# Patient Record
Sex: Female | Born: 1990 | Race: Black or African American | Hispanic: No | Marital: Single | Smoking: Former smoker
Health system: Southern US, Community
[De-identification: ages and names within clinical notes are randomized; demographics above are authoritative.]

## PROBLEM LIST (undated history)

## (undated) DIAGNOSIS — C8190 Hodgkin lymphoma, unspecified, unspecified site: Secondary | ICD-10-CM

---
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
## 2012-12-08 ENCOUNTER — Encounter (HOSPITAL_BASED_OUTPATIENT_CLINIC_OR_DEPARTMENT_OTHER): Payer: Self-pay | Admitting: *Deleted

## 2012-12-08 ENCOUNTER — Emergency Department (HOSPITAL_BASED_OUTPATIENT_CLINIC_OR_DEPARTMENT_OTHER)
Admission: EM | Admit: 2012-12-08 | Discharge: 2012-12-08 | Disposition: A | Discharge status: Home / Self Care | Payer: BC Managed Care – PPO | Attending: Emergency Medicine | Admitting: Emergency Medicine

## 2012-12-08 ENCOUNTER — Emergency Department (HOSPITAL_BASED_OUTPATIENT_CLINIC_OR_DEPARTMENT_OTHER): Payer: BC Managed Care – PPO

## 2012-12-08 DIAGNOSIS — K6289 Other specified diseases of anus and rectum: Secondary | ICD-10-CM | POA: Insufficient documentation

## 2012-12-08 DIAGNOSIS — Z3202 Encounter for pregnancy test, result negative: Secondary | ICD-10-CM | POA: Insufficient documentation

## 2012-12-08 DIAGNOSIS — K5900 Constipation, unspecified: Secondary | ICD-10-CM

## 2012-12-08 DIAGNOSIS — R109 Unspecified abdominal pain: Secondary | ICD-10-CM | POA: Insufficient documentation

## 2012-12-08 DIAGNOSIS — N3090 Cystitis, unspecified without hematuria: Secondary | ICD-10-CM

## 2012-12-08 DIAGNOSIS — F17200 Nicotine dependence, unspecified, uncomplicated: Secondary | ICD-10-CM | POA: Insufficient documentation

## 2012-12-08 LAB — URINALYSIS, ROUTINE W REFLEX MICROSCOPIC
Bilirubin Urine: NEGATIVE
Glucose, UA: NEGATIVE mg/dL
Ketones, ur: NEGATIVE mg/dL
Nitrite: NEGATIVE
Protein, ur: NEGATIVE mg/dL
Specific Gravity, Urine: 1.029 (ref 1.005–1.030)
Urobilinogen, UA: 1 mg/dL (ref 0.0–1.0)
pH: 7 (ref 5.0–8.0)

## 2012-12-08 LAB — URINE MICROSCOPIC-ADD ON

## 2012-12-08 LAB — PREGNANCY, URINE: Preg Test, Ur: NEGATIVE

## 2012-12-08 IMAGING — CR DG ABDOMEN 1V
2 series · 2 of 2 positions shown · non-contrast
Comparison: None.

CLINICAL DATA: Lower abdominal pain, constipation

ABDOMEN - 1 VIEW

[t abdomen supine (1 of 2)]
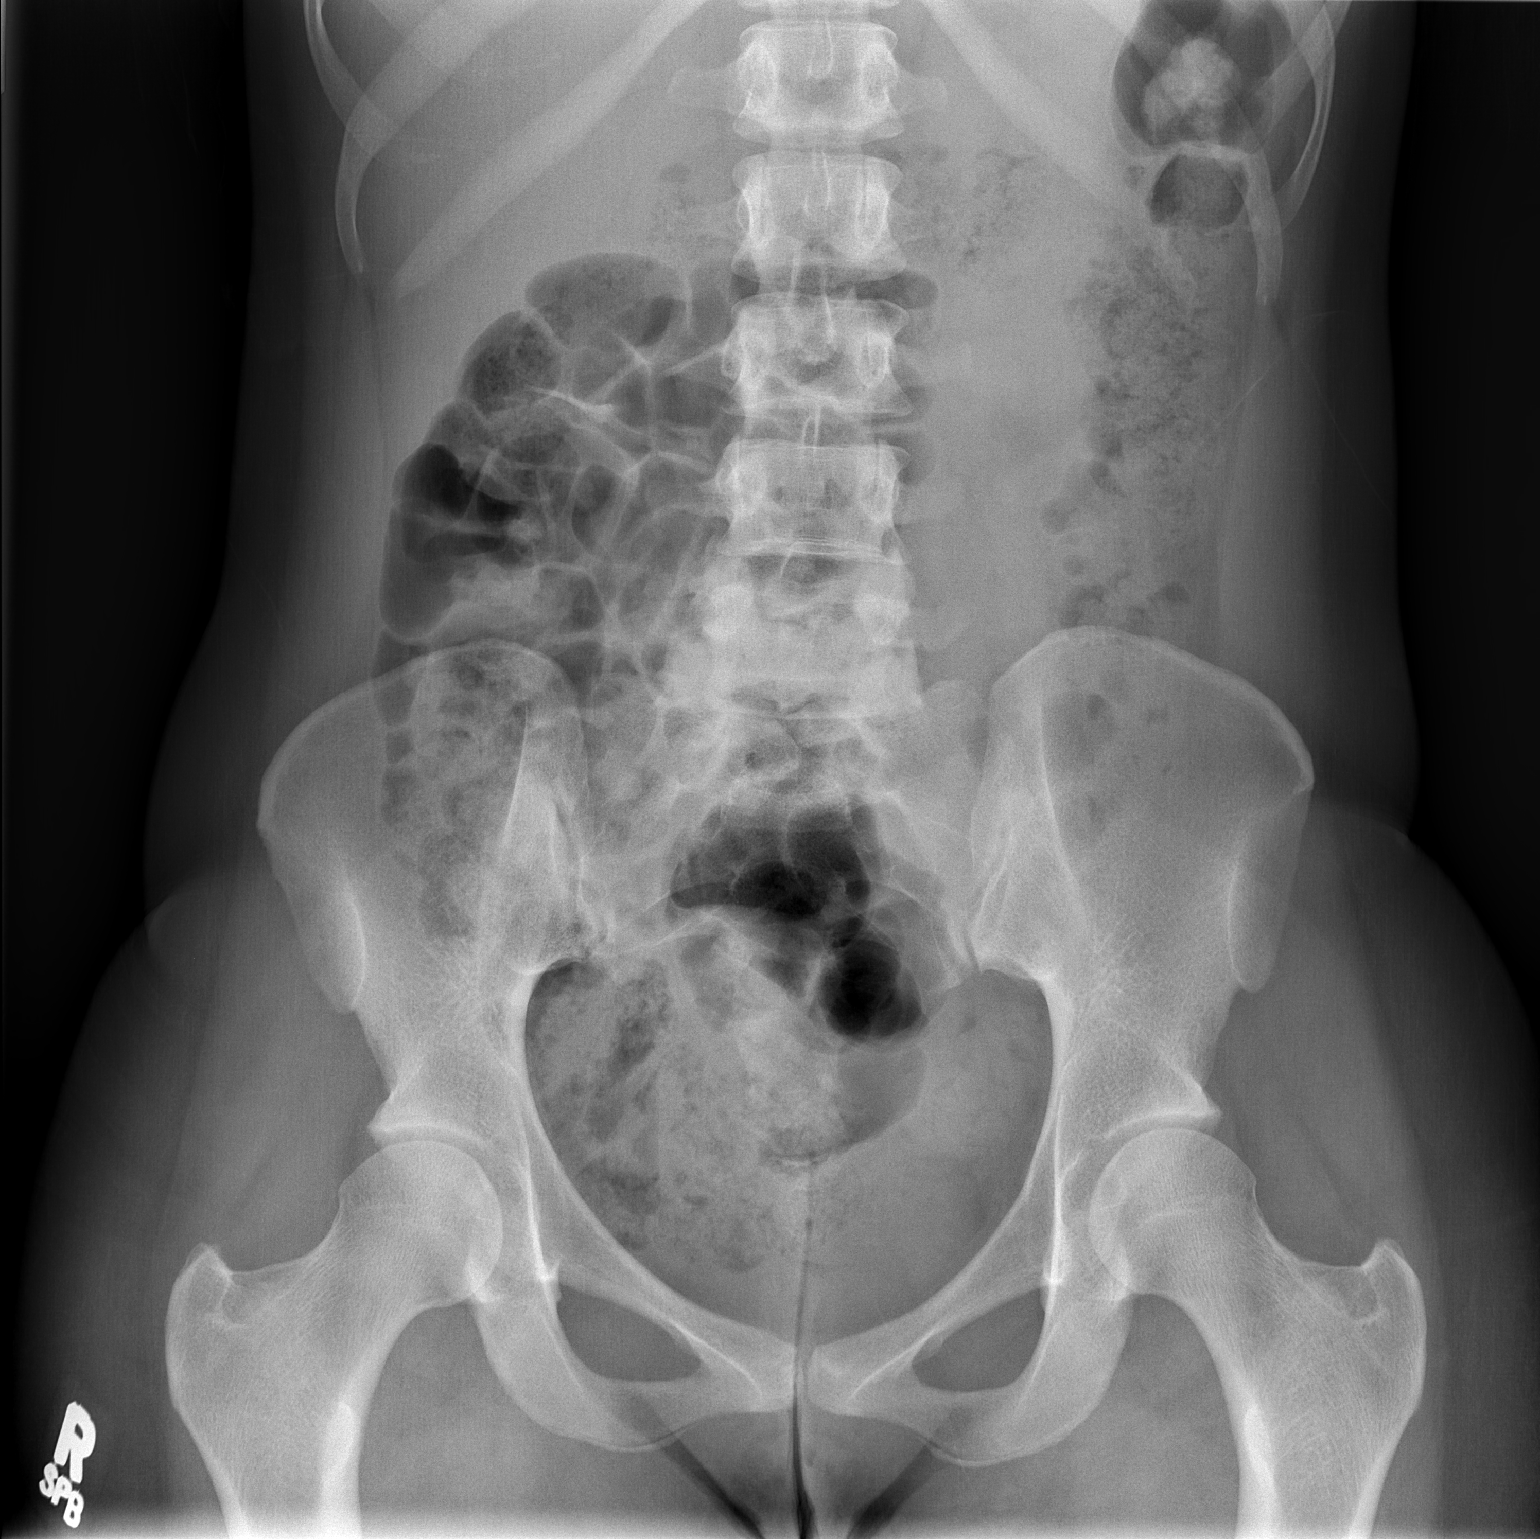

[t abdomen supine (2 of 2)]
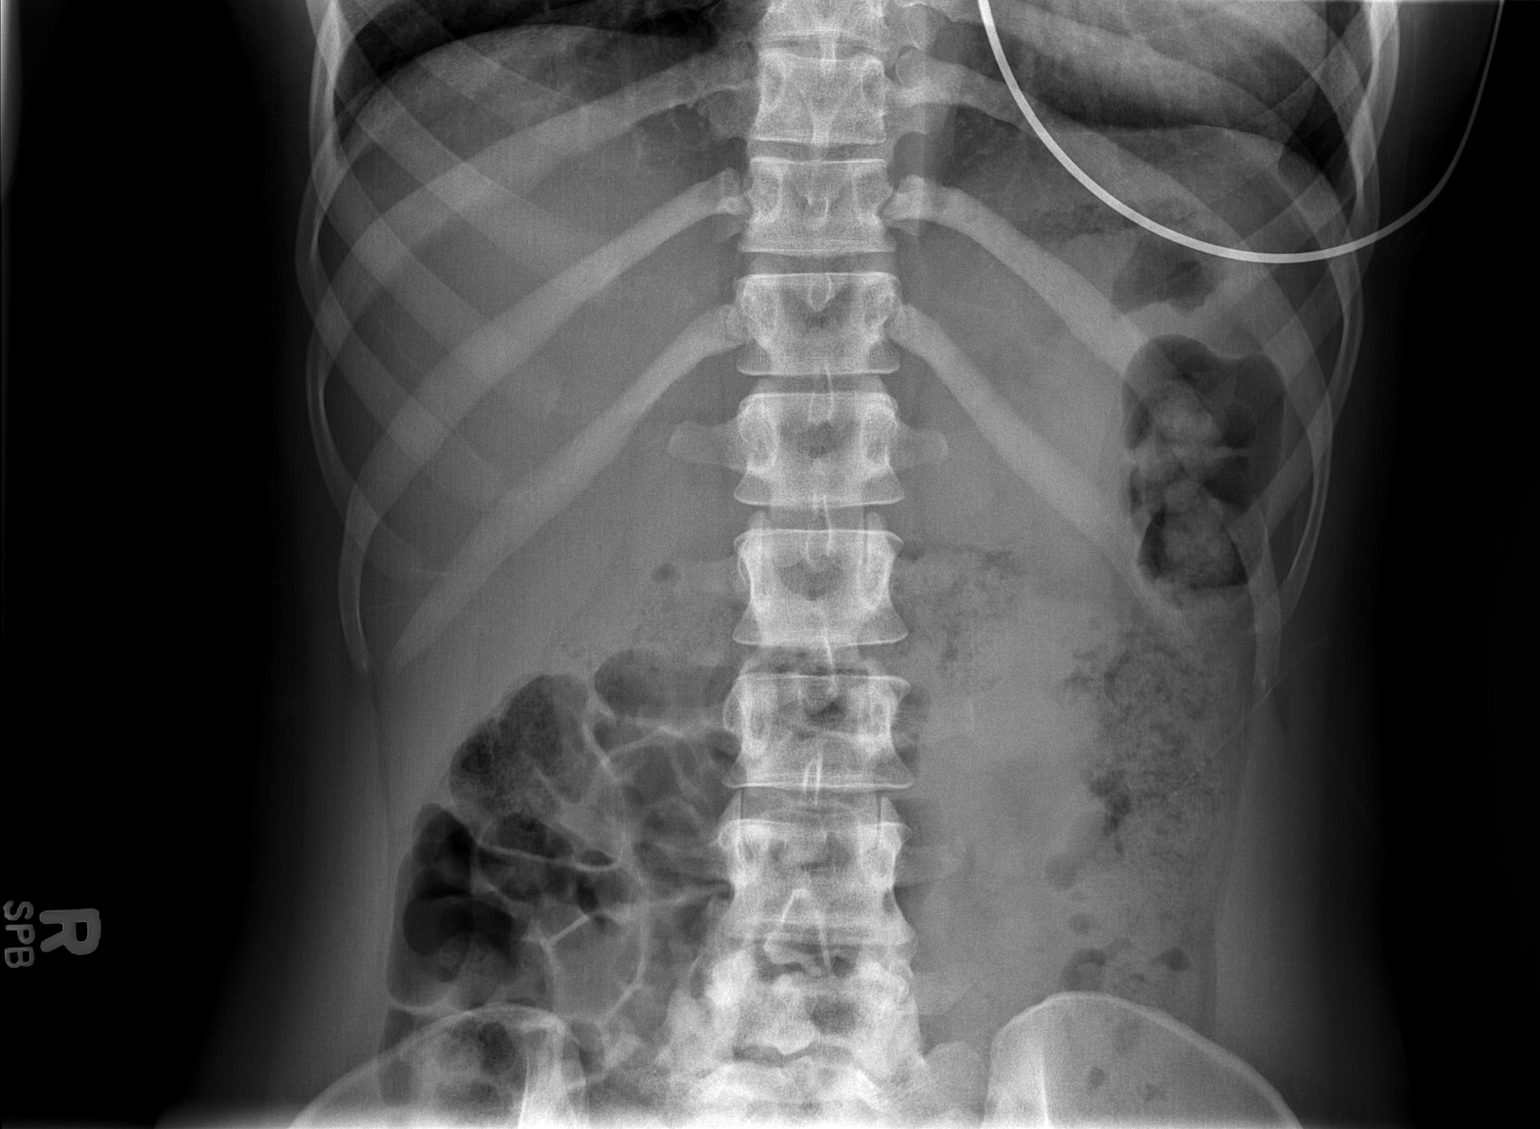

[2 of 2 positions shown; findings below may reference images not displayed]

FINDINGS: Nonobstructive bowel gas pattern.

Moderate stool in the left colon.

Visualized osseous structures are within normal limits.
IMPRESSION: Moderate stool in the left colon, compatible with reported history
of constipation.

## 2012-12-08 MED ORDER — CIPROFLOXACIN HCL 500 MG PO TABS: 500.0000 mg | ORAL_TABLET | Freq: Two times a day (BID) | ORAL | Status: DC

## 2012-12-08 MED ORDER — BISACODYL 5 MG PO TBEC: 5.0000 mg | DELAYED_RELEASE_TABLET | Freq: Two times a day (BID) | ORAL | Status: DC

## 2012-12-08 NOTE — ED Notes (Signed)
Patient transported to X-ray and returned 

## 2012-12-08 NOTE — ED Provider Notes (Signed)
History     CSN: [id number]  Arrival date & time 12/08/12  4098   First MD Initiated Contact with Patient 12/08/12 [phone]      Chief Complaint  Patient presents with  . Vaginal Pain    (Consider location/radiation/quality/duration/timing/severity/associated sxs/prior treatment) HPI Comments: Pt reports had some lower abd pain about 1 month ago, was seen at [facility] and had U/S of pelvis, no diagnosis.  She has been on depo for about 1 year, no periods except occasional spotting.  Pain has relatively improved since then.  [facility], was diagnosed with a yeast infection, pt has not gotten medication filled.  She now has pain again in lower abd and attributes to the exam.  However she reports also that with BM yesterday, she had to strain and pain was worse in both rectum and lower abdomen with straining.  No dysuria or increased frequency.  No N/V/D.  No chills, fevers.  No vaginal discharge or bleeding.  Has tried some NSAIDs with no sig relief.    Patient is a 22 y.o. female presenting with vaginal pain and abdominal pain. The history is provided by the patient.  Vaginal Pain The problem occurs constantly. The problem has not changed since onset.Associated symptoms include abdominal pain. Nothing aggravates the symptoms. Nothing relieves the symptoms.  Abdominal Pain This is a new problem. The current episode started yesterday. Associated symptoms include abdominal pain. Exacerbated by: defecation. Nothing relieves the symptoms. Treatments tried: NSAIDs. The treatment provided no relief.    History reviewed. No pertinent past medical history.  History reviewed. No pertinent past surgical history.  History reviewed. No pertinent family history.  History  Substance Use Topics  . Smoking status: Current Some Day Smoker    Types: Cigars  . Smokeless tobacco: Not on file  . Alcohol Use: No    OB History   Grav Para Term Preterm Abortions TAB SAB Ect  Mult Living                  Review of Systems  Constitutional: Negative for fever and chills.  Gastrointestinal: Positive for abdominal pain and rectal pain. Negative for nausea, vomiting, diarrhea, constipation and blood in stool.  Genitourinary: Negative for dysuria, frequency, flank pain, vaginal bleeding, vaginal discharge and vaginal pain.  Skin: Negative for rash.  All other systems reviewed and are negative.    Allergies  Review of patient's allergies indicates no known allergies.  Home Medications   Current Outpatient Rx  Name  Route  Sig  Dispense  Refill  . bisacodyl (DULCOLAX) 5 MG EC tablet   Oral   Take 1 tablet (5 mg total) by mouth 2 (two) times daily.   14 tablet   0   . ciprofloxacin (CIPRO) 500 MG tablet   Oral   Take 1 tablet (500 mg total) by mouth 2 (two) times daily.   6 tablet   0     BP 114/61  Temp(Src) 98.2 F (36.8 C) (Oral)  Resp 20  Ht 5\' 5"  (1.651 m)  Wt 120 lb (54.432 kg)  BMI 19.97 kg/m2  SpO2 100%  Physical Exam  Nursing note and vitals reviewed. Constitutional: She appears well-developed and well-nourished. No distress.  HENT:  Head: Normocephalic and atraumatic.  Eyes: Conjunctivae and EOM are normal. No scleral icterus.  Neck: Normal range of motion. Neck supple.  Pulmonary/Chest: Breath sounds normal.  Abdominal: Soft. There is no tenderness. There is no rebound  and no guarding.  Pt reports pain with exam in suprapubic area without any guard or rebound tenderness  Genitourinary: Rectal exam shows no external hemorrhoid, no internal hemorrhoid, no fissure, no tenderness and anal tone normal.  Chaperone present  Neurological: She is alert.  Skin: Skin is warm. No rash noted.  Psychiatric: She has a normal mood and affect.    ED Course  Procedures (including critical care time)  Labs Reviewed  URINALYSIS, ROUTINE W REFLEX MICROSCOPIC - Abnormal; Notable for the following:    Hgb urine dipstick TRACE (*)     Leukocytes, UA SMALL (*)    All other components within normal limits  URINE MICROSCOPIC-ADD ON - Abnormal; Notable for the following:    Squamous Epithelial / LPF FEW (*)    Bacteria, UA MANY (*)    All other components within normal limits  URINE CULTURE  PREGNANCY, URINE   Dg Abd 1 View  12/08/2012   *RADIOLOGY REPORT*  Clinical Data: Lower abdominal pain, constipation  ABDOMEN - 1 VIEW  Comparison: None.  Findings: Nonobstructive bowel gas pattern.  Moderate stool in the left colon.  Visualized osseous structures are within normal limits.  IMPRESSION: Moderate stool in the left colon, compatible with reported history of constipation.   Original Report Authenticated By: Charline Bills, M.D.     1. Constipation   2. Cystitis     ra sat is 100% and I interpret to be normal   9:37 AM I reviewed labs and xray results, discsused with pt . Rx for 3 days cipro along with dulcolax.    MDM   Patient had reportedly negative vaginal and abdominal ultrasound less than a month ago at [facility]. In addition [facility], was diagnosed with a yeast infection that she has not begun treatment. Here on exam, the patient complains of some lower abdominal discomfort without any guarding or rebound, patient is afebrile. I do not feel the patient requires imaging and I don't think that she needs a repeat a GYN exam at this time. Given she had pain with defecation yesterday, I feel the patient may send to be constipated. I performed a digital rectal examination which revealed no obvious hemorrhoids, no rectal pain or obvious tear. Patient can be placed on a laxative and to followup with her primary GYN or PCP if symptoms are not improving.        Gavin Pound. Oletta Lamas, MD 12/08/12 [phone]

## 2012-12-08 NOTE — Discharge Instructions (Signed)
Constipation, Adult °Constipation is when a person has fewer than 3 bowel movements a week; has difficulty having a bowel movement; or has stools that are dry, hard, or larger than normal. As people grow older, constipation is more common. If you try to fix constipation with medicines that make you have a bowel movement (laxatives), the problem may get worse. Long-term laxative use may cause the muscles of the colon to become weak. A low-fiber diet, not taking in enough fluids, and taking certain medicines may make constipation worse. °CAUSES  °· Certain medicines, such as antidepressants, pain medicine, iron supplements, antacids, and water pills.   °· Certain diseases, such as diabetes, irritable bowel syndrome (IBS), thyroid disease, or depression.   °· Not drinking enough water.   °· Not eating enough fiber-rich foods.   °· Stress or travel. °· Lack of physical activity or exercise. °· Not going to the restroom when there is the urge to have a bowel movement. °· Ignoring the urge to have a bowel movement. °· Using laxatives too much. °SYMPTOMS  °· Having fewer than 3 bowel movements a week.   °· Straining to have a bowel movement.   °· Having hard, dry, or larger than normal stools.   °· Feeling full or bloated.   °· Pain in the lower abdomen. °· Not feeling relief after having a bowel movement. °DIAGNOSIS  °Your caregiver will take a medical history and perform a physical exam. Further testing may be done for severe constipation. Some tests may include:  °· A barium enema X-ray to examine your rectum, colon, and sometimes, your small intestine. °· A sigmoidoscopy to examine your lower colon. °· A colonoscopy to examine your entire colon. °TREATMENT  °Treatment will depend on the severity of your constipation and what is causing it. Some dietary treatments include drinking more fluids and eating more fiber-rich foods. Lifestyle treatments may include regular exercise. If these diet and lifestyle recommendations  do not help, your caregiver may recommend taking over-the-counter laxative medicines to help you have bowel movements. Prescription medicines may be prescribed if over-the-counter medicines do not work.  °HOME CARE INSTRUCTIONS  °· Increase dietary fiber in your diet, such as fruits, vegetables, whole grains, and beans. Limit high-fat and processed sugars in your diet, such as French fries, hamburgers, cookies, candies, and soda.   °· A fiber supplement may be added to your diet if you cannot get enough fiber from foods.   °· Drink enough fluids to keep your urine clear or pale yellow.   °· Exercise regularly or as directed by your caregiver.   °· Go to the restroom when you have the urge to go. Do not hold it. °· Only take medicines as directed by your caregiver. Do not take other medicines for constipation without talking to your caregiver first. °SEEK IMMEDIATE MEDICAL CARE IF:  °· You have bright red blood in your stool.   °· Your constipation lasts for more than 4 days or gets worse.   °· You have abdominal or rectal pain.   °· You have thin, pencil-like stools. °· You have unexplained weight loss. °MAKE SURE YOU:  °· Understand these instructions. °· Will watch your condition. °· Will get help right away if you are not doing well or get worse. °Document Released: 04/02/2004 Document Revised: 09/27/2011 Document Reviewed: 06/08/2011 °ExitCare® Patient Information ©2014 [employer]. ° °Urinary Tract Infection °Urinary tract infections (UTIs) can develop anywhere along your urinary tract. Your urinary tract is your body's drainage system for removing wastes and extra water. Your urinary tract includes two kidneys, two   ureters, a bladder, and a urethra. Your kidneys are a pair of bean-shaped organs. Each kidney is about the size of your fist. They are located below your ribs, one on each side of your spine. °CAUSES °Infections are caused by microbes, which are microscopic organisms, including fungi, viruses, and  bacteria. These organisms are so small that they can only be seen through a microscope. Bacteria are the microbes that most commonly cause UTIs. °SYMPTOMS  °Symptoms of UTIs may vary by age and gender of the patient and by the location of the infection. Symptoms in young women typically include a frequent and intense urge to urinate and a painful, burning feeling in the bladder or urethra during urination. Older women and men are more likely to be tired, shaky, and weak and have muscle aches and abdominal pain. A fever may mean the infection is in your kidneys. Other symptoms of a kidney infection include pain in your back or sides below the ribs, nausea, and vomiting. °DIAGNOSIS °To diagnose a UTI, your caregiver will ask you about your symptoms. Your caregiver also will ask to provide a urine sample. The urine sample will be tested for bacteria and white blood cells. White blood cells are made by your body to help fight infection. °TREATMENT  °Typically, UTIs can be treated with medication. Because most UTIs are caused by a bacterial infection, they usually can be treated with the use of antibiotics. The choice of antibiotic and length of treatment depend on your symptoms and the type of bacteria causing your infection. °HOME CARE INSTRUCTIONS °· If you were prescribed antibiotics, take them exactly as your caregiver instructs you. Finish the medication even if you feel better after you have only taken some of the medication. °· Drink enough water and fluids to keep your urine clear or pale yellow. °· Avoid caffeine, tea, and carbonated beverages. They tend to irritate your bladder. °· Empty your bladder often. Avoid holding urine for long periods of time. °· Empty your bladder before and after sexual intercourse. °· After a bowel movement, women should cleanse from front to back. Use each tissue only once. °SEEK MEDICAL CARE IF:  °· You have back pain. °· You develop a fever. °· Your symptoms do not begin to  resolve within 3 days. °SEEK IMMEDIATE MEDICAL CARE IF:  °· You have severe back pain or lower abdominal pain. °· You develop chills. °· You have nausea or vomiting. °· You have continued burning or discomfort with urination. °MAKE SURE YOU:  °· Understand these instructions. °· Will watch your condition. °· Will get help right away if you are not doing well or get worse. °Document Released: 04/14/2005 Document Revised: 01/04/2012 Document Reviewed: 08/13/2011 °ExitCare® Patient Information ©2014 [employer]. ° °

## 2012-12-08 NOTE — ED Notes (Signed)
Patient states she developed lower abdominal pain yesterday after [facility].  States she had similar pain one month ago and was seen at [facility] and examined with an Abd Ultrasound and transvaginal ultrasound.  States yesterday she was diagnosed with a yeast infection and has not started the treatment yet.  States she also wants to be checked to see if she is pregnant, LMP unknown due to Depo injections.  States she was [facility] by the [facility] yesterday to wait until June 10th when she goes in for her next scheduled depo injection.  Pt denies any vaginal discharge or drainage.

## 2012-12-08 NOTE — ED Notes (Signed)
MD at bedside.  Dr. Oletta Lamas  In for a rectal exam with chaperone.

## 2012-12-08 NOTE — ED Notes (Signed)
Pt directed to pharmacy to pick up prescriptions-  

## 2012-12-09 LAB — URINE CULTURE

## 2013-05-15 ENCOUNTER — Emergency Department (HOSPITAL_BASED_OUTPATIENT_CLINIC_OR_DEPARTMENT_OTHER)
Admission: EM | Admit: 2013-05-15 | Discharge: 2013-05-15 | Disposition: A | Discharge status: Home / Self Care | Payer: BC Managed Care – PPO | Attending: Emergency Medicine | Admitting: Emergency Medicine

## 2013-05-15 ENCOUNTER — Encounter (HOSPITAL_BASED_OUTPATIENT_CLINIC_OR_DEPARTMENT_OTHER): Payer: Self-pay | Admitting: Emergency Medicine

## 2013-05-15 DIAGNOSIS — Z3202 Encounter for pregnancy test, result negative: Secondary | ICD-10-CM | POA: Insufficient documentation

## 2013-05-15 DIAGNOSIS — Z79899 Other long term (current) drug therapy: Secondary | ICD-10-CM | POA: Insufficient documentation

## 2013-05-15 DIAGNOSIS — S90569A Insect bite (nonvenomous), unspecified ankle, initial encounter: Secondary | ICD-10-CM | POA: Insufficient documentation

## 2013-05-15 DIAGNOSIS — Z792 Long term (current) use of antibiotics: Secondary | ICD-10-CM | POA: Insufficient documentation

## 2013-05-15 DIAGNOSIS — Y939 Activity, unspecified: Secondary | ICD-10-CM | POA: Insufficient documentation

## 2013-05-15 DIAGNOSIS — W57XXXA Bitten or stung by nonvenomous insect and other nonvenomous arthropods, initial encounter: Secondary | ICD-10-CM

## 2013-05-15 DIAGNOSIS — N644 Mastodynia: Secondary | ICD-10-CM | POA: Insufficient documentation

## 2013-05-15 DIAGNOSIS — Y929 Unspecified place or not applicable: Secondary | ICD-10-CM | POA: Insufficient documentation

## 2013-05-15 DIAGNOSIS — F17200 Nicotine dependence, unspecified, uncomplicated: Secondary | ICD-10-CM | POA: Insufficient documentation

## 2013-05-15 LAB — PREGNANCY, URINE: Preg Test, Ur: NEGATIVE

## 2013-05-15 MED ORDER — LORATADINE 10 MG PO TABS
10.0000 mg | ORAL_TABLET | Freq: Every day | ORAL | Status: DC
  Administered 2013-05-15: 10 mg via ORAL

## 2013-05-15 MED ORDER — LORATADINE 10 MG PO TABS
ORAL_TABLET | ORAL | Status: AC
  Filled 2013-05-15: qty 1

## 2013-05-15 MED ORDER — CETIRIZINE HCL 10 MG PO TABS: 10.0000 mg | ORAL_TABLET | Freq: Every evening | ORAL | Status: DC | PRN

## 2013-05-15 NOTE — ED Notes (Signed)
Scattered itchy spots on both legs for "couple days."  Also bil breast pain for "couple days".

## 2013-05-15 NOTE — ED Provider Notes (Signed)
CSN: [id number]     Arrival date & time 05/15/13  0023 History   First MD Initiated Contact with Patient 05/15/13 0207     Chief Complaint  Patient presents with  . Rash   (Consider location/radiation/quality/duration/timing/severity/associated sxs/prior Treatment) HPI This is a 22 year old female with a three-day history of pruritic maculopapular rash on her legs. She has not taken anything for these. She is not aware of being bitten by any insects or being exposed to any allergens. The symptoms are mild to moderate. She is also complaining of several days of soreness in her breasts. The soreness is diffuse. She has not had her Depo-Provera renewed and has not had a period yet. She denies galactorrhea.  History reviewed. No pertinent past medical history. History reviewed. No pertinent past surgical history. No family history on file. History  Substance Use Topics  . Smoking status: Current Some Day Smoker    Types: Cigarettes  . Smokeless tobacco: Not on file  . Alcohol Use: No   OB History   Grav Para Term Preterm Abortions TAB SAB Ect Mult Living                 Review of Systems  All other systems reviewed and are negative.    Allergies  Review of patient's allergies indicates no known allergies.  Home Medications   Current Outpatient Rx  Name  Route  Sig  Dispense  Refill  . bisacodyl (DULCOLAX) 5 MG EC tablet   Oral   Take 1 tablet (5 mg total) by mouth 2 (two) times daily.   14 tablet   0   . ciprofloxacin (CIPRO) 500 MG tablet   Oral   Take 1 tablet (500 mg total) by mouth 2 (two) times daily.   6 tablet   0    BP 108/67  Pulse 59  Temp(Src) 99.1 F (37.3 C) (Oral)  Resp 16  Ht 5' 5.5" (1.664 m)  Wt 130 lb (58.968 kg)  BMI 21.3 kg/m2  SpO2 100%  Physical Exam General: Well-developed, well-nourished female in no acute distress; appearance consistent with age of record HENT: normocephalic; atraumatic Eyes: pupils equal, round and reactive to  light; extraocular muscles intact Neck: supple Heart: regular rate and rhythm Lungs: clear to auscultation bilaterally Chest: Breasts symmetric with diffuse tenderness; no erythema or warmth; no mass palpated; partially inverted nipple on the right; no galactorrhea Abdomen: soft; nondistended; nontender Extremities: No deformity; full range of motion Neurologic: Awake, alert and oriented; motor function intact in all extremities and symmetric; no facial droop Skin: Warm and dry; sparse maculopapular rash of the lower extremities Psychiatric: Flat affect    ED Course  Procedures (including critical care time)   MDM  Lesions on legs and the appearance of insect bites such as bed bugs. The patient is not aware of exposure to insect bites. We will treat her symptomatically. She was advised to launder her linens.    Hanley Seamen, MD 05/15/13 [phone]

## 2013-05-15 NOTE — ED Notes (Signed)
rx x 1 for zrytec given

## 2013-05-15 NOTE — ED Notes (Signed)
C/o bilateral breast tenderness and fullness. Went off depo in May and has not yet had a menstrual cycle

## 2013-07-09 ENCOUNTER — Emergency Department (HOSPITAL_BASED_OUTPATIENT_CLINIC_OR_DEPARTMENT_OTHER)
Admission: EM | Admit: 2013-07-09 | Discharge: 2013-07-09 | Disposition: A | Discharge status: Home / Self Care | Payer: BC Managed Care – PPO | Attending: Emergency Medicine | Admitting: Emergency Medicine

## 2013-07-09 ENCOUNTER — Encounter (HOSPITAL_BASED_OUTPATIENT_CLINIC_OR_DEPARTMENT_OTHER): Payer: Self-pay | Admitting: Emergency Medicine

## 2013-07-09 DIAGNOSIS — Z792 Long term (current) use of antibiotics: Secondary | ICD-10-CM | POA: Insufficient documentation

## 2013-07-09 DIAGNOSIS — Z79899 Other long term (current) drug therapy: Secondary | ICD-10-CM | POA: Insufficient documentation

## 2013-07-09 DIAGNOSIS — F17200 Nicotine dependence, unspecified, uncomplicated: Secondary | ICD-10-CM | POA: Insufficient documentation

## 2013-07-09 DIAGNOSIS — J069 Acute upper respiratory infection, unspecified: Secondary | ICD-10-CM | POA: Insufficient documentation

## 2013-07-09 MED ORDER — BENZONATATE 100 MG PO CAPS: 100.0000 mg | ORAL_CAPSULE | Freq: Three times a day (TID) | ORAL | Status: DC

## 2013-07-09 NOTE — ED Provider Notes (Signed)
CSN: [id number]     Arrival date & time 07/09/13  1730 History   First MD Initiated Contact with Patient 07/09/13 1737     Chief Complaint  Patient presents with  . URI   (Consider location/radiation/quality/duration/timing/severity/associated sxs/prior Treatment) Patient is a 22 y.o. female presenting with URI. The history is provided by the patient. No language interpreter was used.  URI Presenting symptoms: congestion, cough, rhinorrhea and sore throat   Presenting symptoms: no fever   Severity:  Moderate Associated symptoms: no headaches and no myalgias   Associated symptoms comment:  URI symptoms x 2 days without fever. NO nausea, vomiting or abdominal pain.   History reviewed. No pertinent past medical history. History reviewed. No pertinent past surgical history. History reviewed. No pertinent family history. History  Substance Use Topics  . Smoking status: Current Some Day Smoker -- 0.50 packs/day    Types: Cigarettes  . Smokeless tobacco: Not on file  . Alcohol Use: No   OB History   Grav Para Term Preterm Abortions TAB SAB Ect Mult Living                 Review of Systems  Constitutional: Negative for fever.  HENT: Positive for congestion, rhinorrhea and sore throat.   Respiratory: Positive for cough.   Gastrointestinal: Negative for nausea and vomiting.  Genitourinary: Negative for dysuria.  Musculoskeletal: Negative for myalgias.  Skin: Negative for rash.  Neurological: Negative for headaches.    Allergies  Review of patient's allergies indicates no known allergies.  Home Medications   Current Outpatient Rx  Name  Route  Sig  Dispense  Refill  . bisacodyl (DULCOLAX) 5 MG EC tablet   Oral   Take 1 tablet (5 mg total) by mouth 2 (two) times daily.   14 tablet   0   . cetirizine (ZYRTEC) 10 MG tablet   Oral   Take 1 tablet (10 mg total) by mouth at bedtime as needed (for itching).         . ciprofloxacin (CIPRO) 500 MG tablet   Oral   Take 1  tablet (500 mg total) by mouth 2 (two) times daily.   6 tablet   0    BP 105/66  Pulse 89  Temp(Src) 99.2 F (37.3 C) (Oral)  Resp 16  Ht 5\' 5"  (1.651 m)  Wt 130 lb (58.968 kg)  BMI 21.63 kg/m2  SpO2 100% Physical Exam  Constitutional: She is oriented to person, place, and time. She appears well-developed and well-nourished.  HENT:  Head: Normocephalic.  Nose: Mucosal edema present.  Mouth/Throat: Mucous membranes are normal. Posterior oropharyngeal erythema present. No posterior oropharyngeal edema.  Neck: Normal range of motion. Neck supple.  Cardiovascular: Normal rate and regular rhythm.   Pulmonary/Chest: Effort normal and breath sounds normal.  Abdominal: Soft. Bowel sounds are normal. There is no tenderness. There is no rebound and no guarding.  Musculoskeletal: Normal range of motion.  Neurological: She is alert and oriented to person, place, and time.  Skin: Skin is warm and dry. No rash noted.  Psychiatric: She has a normal mood and affect.    ED Course  Procedures (including critical care time) Labs Review Labs Reviewed - No data to display Imaging Review No results found.  EKG Interpretation   None       MDM  No diagnosis found. 1. URI  Uncomplicated URI symptoms in well-appearing patient.    Arnoldo Hooker, PA-C 07/09/13 1800

## 2013-07-09 NOTE — ED Provider Notes (Signed)
Medical screening examination/treatment/procedure(s) were performed by non-physician practitioner and as supervising physician I was immediately available for consultation/collaboration.  EKG Interpretation   None         Gwyneth Sprout, MD 07/09/13 2315

## 2013-07-09 NOTE — ED Notes (Signed)
Pt c/o uri symptoms x 4 days reports when she coughs her back , abd, and head hurts

## 2015-12-15 ENCOUNTER — Emergency Department (HOSPITAL_BASED_OUTPATIENT_CLINIC_OR_DEPARTMENT_OTHER): Payer: Self-pay

## 2015-12-15 ENCOUNTER — Emergency Department (HOSPITAL_BASED_OUTPATIENT_CLINIC_OR_DEPARTMENT_OTHER)
Admission: EM | Admit: 2015-12-15 | Discharge: 2015-12-15 | Disposition: A | Discharge status: Home / Self Care | Payer: Self-pay | Attending: Emergency Medicine | Admitting: Emergency Medicine

## 2015-12-15 ENCOUNTER — Encounter (HOSPITAL_BASED_OUTPATIENT_CLINIC_OR_DEPARTMENT_OTHER): Payer: Self-pay | Admitting: Emergency Medicine

## 2015-12-15 DIAGNOSIS — R197 Diarrhea, unspecified: Secondary | ICD-10-CM | POA: Insufficient documentation

## 2015-12-15 DIAGNOSIS — F17210 Nicotine dependence, cigarettes, uncomplicated: Secondary | ICD-10-CM | POA: Insufficient documentation

## 2015-12-15 DIAGNOSIS — R110 Nausea: Secondary | ICD-10-CM | POA: Insufficient documentation

## 2015-12-15 DIAGNOSIS — R011 Cardiac murmur, unspecified: Secondary | ICD-10-CM | POA: Insufficient documentation

## 2015-12-15 HISTORY — DX: Hodgkin lymphoma, unspecified, unspecified site: C81.90

## 2015-12-15 LAB — CBC WITH DIFFERENTIAL/PLATELET
BASOS ABS: 0 10*3/uL (ref 0.0–0.1)
Basophils Relative: 0 %
Eosinophils Absolute: 0.1 10*3/uL (ref 0.0–0.7)
Eosinophils Relative: 3 %
HEMATOCRIT: 35.7 % — AB (ref 36.0–46.0)
HEMOGLOBIN: 11.8 g/dL — AB (ref 12.0–15.0)
LYMPHS PCT: 51 %
Lymphs Abs: 2 10*3/uL (ref 0.7–4.0)
MCH: 29.6 pg (ref 26.0–34.0)
MCHC: 33.1 g/dL (ref 30.0–36.0)
MCV: 89.5 fL (ref 78.0–100.0)
MONO ABS: 0.4 10*3/uL (ref 0.1–1.0)
MONOS PCT: 10 %
NEUTROS ABS: 1.4 10*3/uL — AB (ref 1.7–7.7)
Neutrophils Relative %: 36 %
Platelets: 134 10*3/uL — ABNORMAL LOW (ref 150–400)
RBC: 3.99 MIL/uL (ref 3.87–5.11)
RDW: 12.2 % (ref 11.5–15.5)
WBC: 3.9 10*3/uL — ABNORMAL LOW (ref 4.0–10.5)

## 2015-12-15 LAB — URINE MICROSCOPIC-ADD ON

## 2015-12-15 LAB — URINALYSIS, ROUTINE W REFLEX MICROSCOPIC
Bilirubin Urine: NEGATIVE
GLUCOSE, UA: NEGATIVE mg/dL
HGB URINE DIPSTICK: NEGATIVE
Ketones, ur: NEGATIVE mg/dL
Nitrite: NEGATIVE
Protein, ur: NEGATIVE mg/dL
SPECIFIC GRAVITY, URINE: 1.03 (ref 1.005–1.030)
pH: 7.5 (ref 5.0–8.0)

## 2015-12-15 LAB — PREGNANCY, URINE: Preg Test, Ur: NEGATIVE

## 2015-12-15 LAB — COMPREHENSIVE METABOLIC PANEL
ALK PHOS: 59 U/L (ref 38–126)
ALT: 10 U/L — ABNORMAL LOW (ref 14–54)
AST: 22 U/L (ref 15–41)
Albumin: 4.2 g/dL (ref 3.5–5.0)
Anion gap: 5 (ref 5–15)
BILIRUBIN TOTAL: 0.7 mg/dL (ref 0.3–1.2)
BUN: 12 mg/dL (ref 6–20)
CALCIUM: 8.6 mg/dL — AB (ref 8.9–10.3)
CO2: 25 mmol/L (ref 22–32)
Chloride: 108 mmol/L (ref 101–111)
Creatinine, Ser: 0.61 mg/dL (ref 0.44–1.00)
GFR calc Af Amer: >60 mL/min (ref >60)
Glucose, Bld: 102 mg/dL — ABNORMAL HIGH (ref 65–99)
POTASSIUM: 4.1 mmol/L (ref 3.5–5.1)
Sodium: 138 mmol/L (ref 135–145)
TOTAL PROTEIN: 6.6 g/dL (ref 6.5–8.1)

## 2015-12-15 IMAGING — CR DG ABDOMEN ACUTE W/ 1V CHEST
3 series · 3 of 3 positions shown · non-contrast
Comparison: Chest x-ray 04/24/2015.

CLINICAL DATA: 25-year-old female with history of nausea for the
past 3 weeks. Recently completed therapy for Hodgkin's lymphoma in
September 2015.

EXAM:
DG ABDOMEN ACUTE W/ 1V CHEST

[w chest pa]
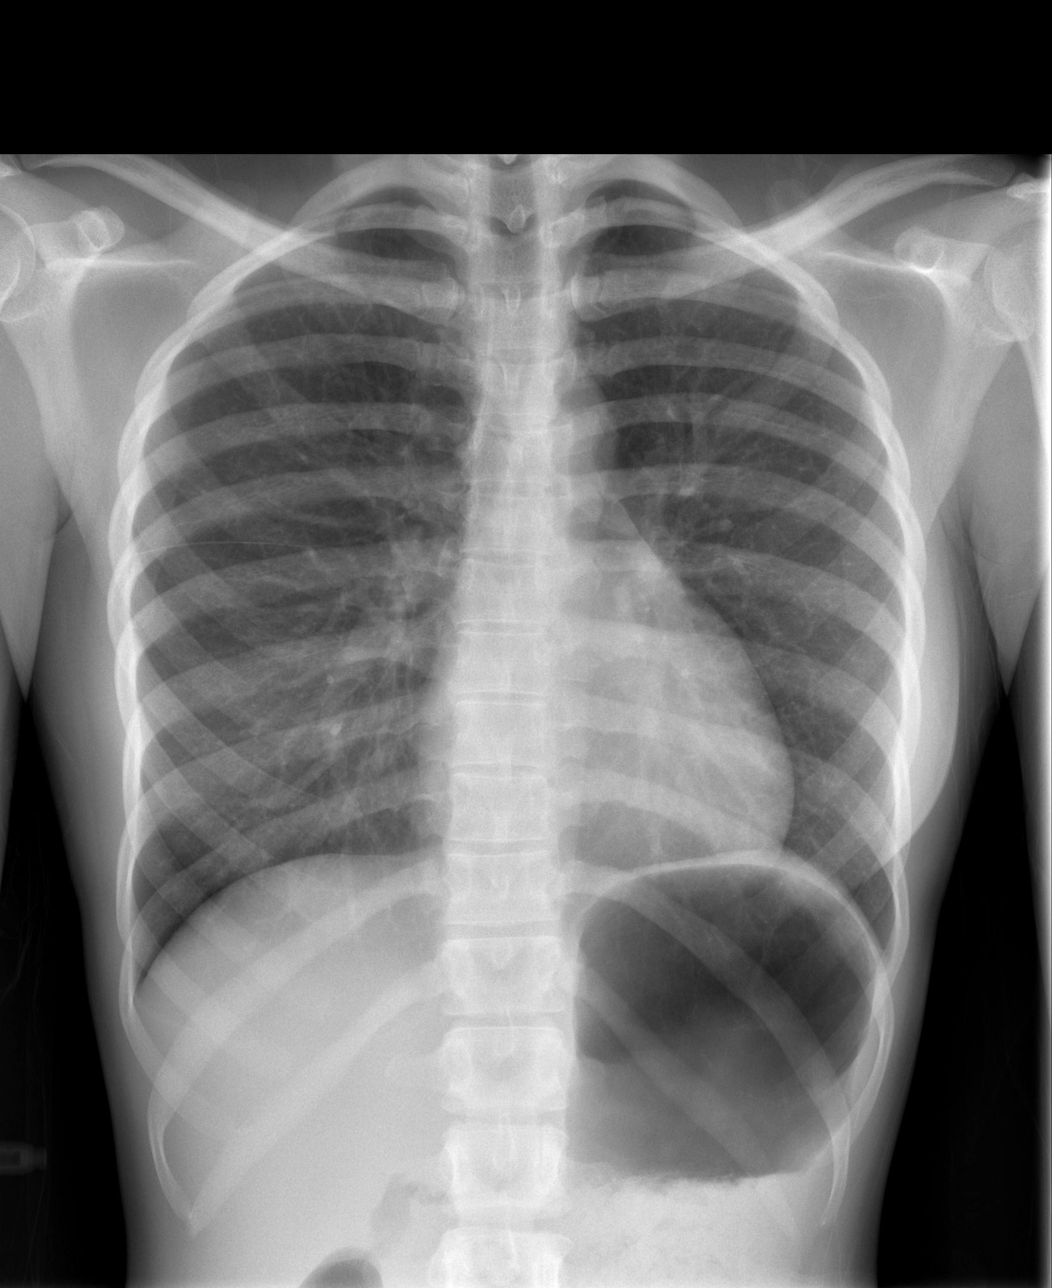

[w abdomen upright]
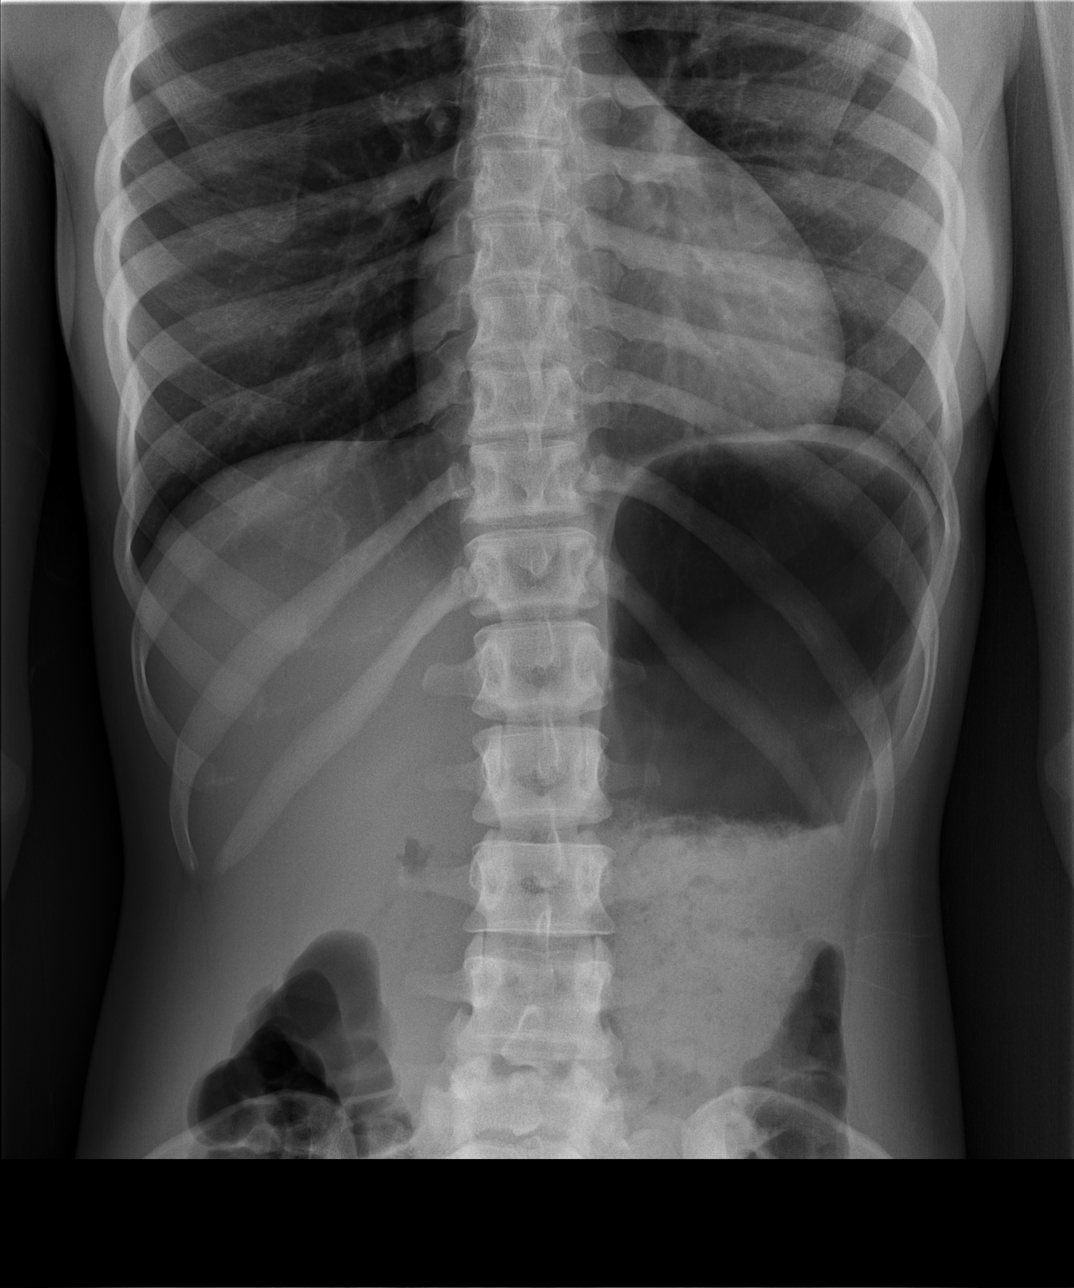

[t abdomen supine]
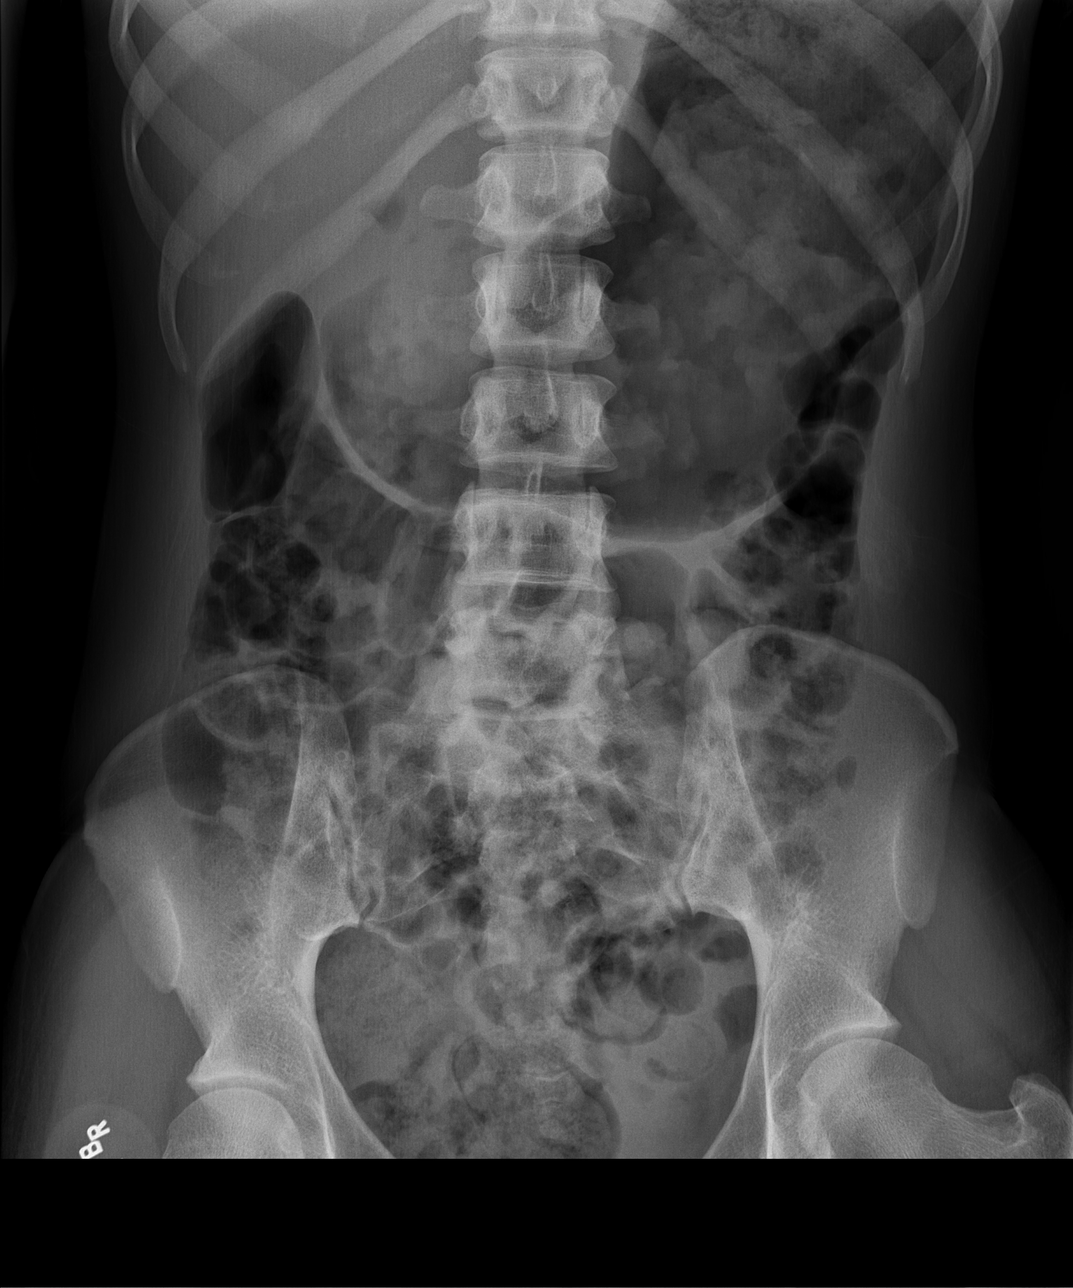

[3 of 3 positions shown; findings below may reference images not displayed]

FINDINGS: Lung volumes are normal. No consolidative airspace disease. No
pleural effusions. No pneumothorax. No pulmonary nodule or mass
noted. Pulmonary vasculature and the cardiomediastinal silhouette
are within normal limits.

Gas and stool are seen scattered throughout the colon extending to
the level of the distal rectum. No pathologic distension of small
bowel is noted. No gross evidence of pneumoperitoneum.
IMPRESSION: 1.  Nonobstructive bowel gas pattern.
2. No pneumoperitoneum.
3. No radiographic evidence of acute cardiopulmonary disease.

## 2015-12-15 MED ORDER — ONDANSETRON 4 MG PO TBDP
4.0000 mg | ORAL_TABLET | Freq: Once | ORAL | Status: AC
  Administered 2015-12-15: 4 mg via ORAL
  Filled 2015-12-15: qty 1

## 2015-12-15 MED ORDER — ONDANSETRON 4 MG PO TBDP: 4.0000 mg | ORAL_TABLET | Freq: Three times a day (TID) | ORAL | Status: DC | PRN

## 2015-12-15 NOTE — ED Notes (Signed)
Pa  at bedside. 

## 2015-12-15 NOTE — Discharge Instructions (Signed)
Please read and follow all provided instructions.  Your diagnoses today include:  1. Nausea    Tests performed today include:  Blood counts and electrolytes - slightly low white blood cell count and platelets  Urine test - no infection  Vital signs. See below for your results today.   Medications prescribed:   Zofran (ondansetron) - for nausea and vomiting  Take any prescribed medications only as directed.  Home care instructions:  Follow any educational materials contained in this packet.  BE VERY CAREFUL not to take multiple medicines containing Tylenol (also called acetaminophen). Doing so can lead to an overdose which can damage your liver and cause liver failure and possibly death.   Follow-up instructions: Please follow-up with your primary care provider in the next 3 days for further evaluation of your symptoms.   Return instructions:   Please return to the Emergency Department if you experience worsening symptoms.   Please return if you have any other emergent concerns.  Additional Information:  Your vital signs today were: BP 107/64 mmHg   Pulse 73   Temp(Src) 98.7 F (37.1 C) (Oral)   Resp 18   Ht 5\' 5"  (1.651 m)   Wt 52.164 kg   BMI 19.14 kg/m2   SpO2 100%   LMP 11/27/2015 (Approximate) If your blood pressure (BP) was elevated above 135/85 this visit, please have this repeated by your doctor within one month. --------------

## 2015-12-15 NOTE — ED Notes (Signed)
Family at bedside. 

## 2015-12-15 NOTE — ED Provider Notes (Signed)
CSN: [id number]     Arrival date & time 12/15/15  1726 History   First MD Initiated Contact with Patient 12/15/15 1749     Chief Complaint  Patient presents with  . Nausea     (Consider location/radiation/quality/duration/timing/severity/associated sxs/prior Treatment) HPI Comments: Patient with history of Hodgkin's lymphoma, finished chemotherapy 8 months ago -- presents with three-week history of severe nausea especially with eating. No vomiting or abdominal pain. No fevers, chills. Patient has had more frequent watery bowel movements or blood. No urinary symptoms. She continues to drink plenty of fluids. She has not sought evaluation for this from her physicians. She states that her doctors have wanted her to gain weight, however this has been a problem with her recent symptoms. The onset of this condition was acute. The course is constant. Alleviating factors: none.    The history is provided by the patient.    Past Medical History  Diagnosis Date  . Hodgkin's lymphoma ([place])    History reviewed. No pertinent past surgical history. No family history on file. Social History  Substance Use Topics  . Smoking status: Current Some Day Smoker -- 0.50 packs/day    Types: Cigarettes  . Smokeless tobacco: None  . Alcohol Use: No   OB History    No data available     Review of Systems  Constitutional: Negative for fever.  HENT: Negative for rhinorrhea and sore throat.   Eyes: Negative for redness.  Respiratory: Negative for cough.   Cardiovascular: Negative for chest pain.  Gastrointestinal: Positive for nausea and diarrhea. Negative for vomiting, abdominal pain and blood in stool.  Genitourinary: Negative for dysuria.  Musculoskeletal: Negative for myalgias.  Skin: Negative for rash.  Neurological: Negative for headaches.   Allergies  Review of patient's allergies indicates no known allergies.  Home Medications   Prior to Admission medications   Medication Sig Start Date  End Date Taking? Authorizing Provider  benzonatate (TESSALON) 100 MG capsule Take 1 capsule (100 mg total) by mouth every 8 (eight) hours. 07/09/13   Charlann Lange, PA-C  bisacodyl (DULCOLAX) 5 MG EC tablet Take 1 tablet (5 mg total) by mouth 2 (two) times daily. 12/08/12   Kingsley Spittle, MD  cetirizine (ZYRTEC) 10 MG tablet Take 1 tablet (10 mg total) by mouth at bedtime as needed (for itching). 05/15/13   John Molpus, MD  ciprofloxacin (CIPRO) 500 MG tablet Take 1 tablet (500 mg total) by mouth 2 (two) times daily. 12/08/12   Kingsley Spittle, MD   BP 107/64 mmHg  Pulse 73  Temp(Src) 98.7 F (37.1 C) (Oral)  Resp 18  Ht 5\' 5"  (1.651 m)  Wt 52.164 kg  BMI 19.14 kg/m2  SpO2 100%  LMP 11/27/2015 (Approximate)   Physical Exam  Constitutional: She appears well-developed and well-nourished.  Patient is been  HENT:  Head: Normocephalic and atraumatic.  Eyes: Conjunctivae are normal. Right eye exhibits no discharge. Left eye exhibits no discharge.  Neck: Normal range of motion. Neck supple.  Cardiovascular: Normal rate and regular rhythm.   Murmur (3/6, systolic) heard. Pulmonary/Chest: Effort normal and breath sounds normal. No respiratory distress. She has no wheezes. She has no rales.  Abdominal: Soft. There is no tenderness. There is no rebound and no guarding.  Neurological: She is alert.  Skin: Skin is warm and dry.  Psychiatric: She has a normal mood and affect.  Nursing note and vitals reviewed.   ED Course  Procedures (including critical care time) Labs Review Labs Reviewed  URINALYSIS, ROUTINE W REFLEX MICROSCOPIC (NOT AT [facility]) - Abnormal; Notable for the following:    APPearance CLOUDY (*)    Leukocytes, UA SMALL (*)    All other components within normal limits  CBC WITH DIFFERENTIAL/PLATELET - Abnormal; Notable for the following:    WBC 3.9 (*)    Hemoglobin 11.8 (*)    HCT 35.7 (*)    Platelets 134 (*)    Neutro Abs 1.4 (*)    All other components within normal limits   COMPREHENSIVE METABOLIC PANEL - Abnormal; Notable for the following:    Glucose, Bld 102 (*)    Calcium 8.6 (*)    ALT 10 (*)    All other components within normal limits  URINE MICROSCOPIC-ADD ON - Abnormal; Notable for the following:    Squamous Epithelial / LPF 6-30 (*)    Bacteria, UA FEW (*)    All other components within normal limits  PREGNANCY, URINE    Imaging Review Dg Abd Acute W/chest  12/15/2015  CLINICAL DATA:  25 year old female with history of nausea for the past 3 weeks. Recently completed therapy for Hodgkin's lymphoma in March 2017. EXAM: DG ABDOMEN ACUTE W/ 1V CHEST COMPARISON:  Chest x-ray 04/24/2015. FINDINGS: Lung volumes are normal. No consolidative airspace disease. No pleural effusions. No pneumothorax. No pulmonary nodule or mass noted. Pulmonary vasculature and the cardiomediastinal silhouette are within normal limits. Gas and stool are seen scattered throughout the colon extending to the level of the distal rectum. No pathologic distension of small bowel is noted. No gross evidence of pneumoperitoneum. IMPRESSION: 1.  Nonobstructive bowel gas pattern. 2. No pneumoperitoneum. 3. No radiographic evidence of acute cardiopulmonary disease. Electronically Signed   By: Vinnie Langton M.D.   On: 12/15/2015 18:23   I have personally reviewed and evaluated these images and lab results as part of my medical decision-making.   EKG Interpretation None      5:58 PM Patient seen and examined. Work-up initiated. Medications ordered.   Vital signs reviewed and are as follows: BP 107/64 mmHg  Pulse 73  Temp(Src) 98.7 F (37.1 C) (Oral)  Resp 18  Ht 5\' 5"  (1.651 m)  Wt 52.164 kg  BMI 19.14 kg/m2  SpO2 100%  LMP 11/27/2015 (Approximate)  6:35 PM Labs are reassuring. Patient states blood cell counts are typically slightly low.   8:10 PM patient is tolerating oral fluids and is ready to go home. Will discharge to home with Zofran. Encouraged PCP follow-up this week  for continued management and evaluation.  The patient was urged to return to the Emergency Department immediately with worsening of current symptoms, worsening abdominal pain, persistent vomiting, blood noted in stools, fever, or any other concerns. The patient verbalized understanding.    MDM   Final diagnoses:  Nausea   Patient with history of nausea for the past 3 weeks. No abdominal pain or fever. She has had some diarrhea. Workup and exam today are reassuring. X-ray does not show any signs of obstruction. Patient finished chemotherapy 8 months ago. No indications for admission at this time. She is tolerating oral fluids without vomiting. Will discharge to home with close PCP follow-up. Return instructions as above.   Carlisle Cater, PA-C 12/15/15 2012  Dorie Rank, MD 12/15/15 [phone]

## 2015-12-15 NOTE — ED Notes (Signed)
Nausea for the past 3 wks. Finished tx for Hodgkins Lymphoma in March.  Denies vomiting or diarrhea. Has not seen oncologist or pmd for this.

## 2015-12-15 NOTE — ED Notes (Signed)
Patient transported to X-ray 

## 2016-10-19 ENCOUNTER — Emergency Department (HOSPITAL_BASED_OUTPATIENT_CLINIC_OR_DEPARTMENT_OTHER)
Admission: EM | Admit: 2016-10-19 | Discharge: 2016-10-20 | Disposition: A | Discharge status: Home / Self Care | Payer: Medicaid Other | Attending: Emergency Medicine | Admitting: Emergency Medicine

## 2016-10-19 ENCOUNTER — Encounter (HOSPITAL_BASED_OUTPATIENT_CLINIC_OR_DEPARTMENT_OTHER): Payer: Self-pay | Admitting: Emergency Medicine

## 2016-10-19 DIAGNOSIS — R197 Diarrhea, unspecified: Secondary | ICD-10-CM | POA: Insufficient documentation

## 2016-10-19 DIAGNOSIS — F17210 Nicotine dependence, cigarettes, uncomplicated: Secondary | ICD-10-CM | POA: Insufficient documentation

## 2016-10-19 DIAGNOSIS — R1032 Left lower quadrant pain: Secondary | ICD-10-CM | POA: Insufficient documentation

## 2016-10-19 DIAGNOSIS — R112 Nausea with vomiting, unspecified: Secondary | ICD-10-CM | POA: Insufficient documentation

## 2016-10-19 LAB — URINALYSIS, ROUTINE W REFLEX MICROSCOPIC
Glucose, UA: NEGATIVE mg/dL
Ketones, ur: 15 mg/dL — AB
Nitrite: NEGATIVE
PH: 5.5 (ref 5.0–8.0)
Protein, ur: NEGATIVE mg/dL
SPECIFIC GRAVITY, URINE: 1.035 — AB (ref 1.005–1.030)

## 2016-10-19 LAB — PREGNANCY, URINE: PREG TEST UR: NEGATIVE

## 2016-10-19 LAB — URINALYSIS, MICROSCOPIC (REFLEX)

## 2016-10-19 MED ORDER — ONDANSETRON 8 MG PO TBDP
8.0000 mg | ORAL_TABLET | Freq: Once | ORAL | Status: AC
Start: 2016-10-19 — End: 2016-10-20
  Administered 2016-10-20: 8 mg via ORAL
  Filled 2016-10-19: qty 1

## 2016-10-19 MED ORDER — DICYCLOMINE HCL 10 MG/ML IM SOLN
20.0000 mg | Freq: Once | INTRAMUSCULAR | Status: AC
  Administered 2016-10-20: 20 mg via INTRAMUSCULAR
  Filled 2016-10-19: qty 2

## 2016-10-19 MED ORDER — LOPERAMIDE HCL 2 MG PO CAPS
4.0000 mg | ORAL_CAPSULE | Freq: Once | ORAL | Status: AC
  Administered 2016-10-20: 4 mg via ORAL
  Filled 2016-10-19: qty 2

## 2016-10-19 NOTE — ED Notes (Signed)
Pt reports that she is here for N/V/D. Pt reports this is going on now for 2 days and has not been seen for same.

## 2016-10-19 NOTE — ED Provider Notes (Signed)
By signing my name below, I, Dolores Hoose, attest that this documentation has been prepared under the direction and in the presence of [place], DO . Electronically Signed: Dolores Hoose, Scribe. 10/19/2016. 11:18 PM.  TIME SEEN: 11:33 PM  CHIEF COMPLAINT: Abdominal Pain  HPI:  Christina Hunt is a 26 y.o. female with pmhx of Hodgkin's Lymphoma who presents to the Emergency Department complaining of constant, mild, crampy lower abdominal pain onset 2 days. She describes her symptoms as a cramping pain as if her stomach is in a ball. No modifying factors indicated. Pt reports associated chills, vomiting, nausea and diarrhea. No medications PTA. Denies any dysuria, hematuria, vaginal bleeding or vaginal discharge. No abdominal pshx. No recent abx use or recent travel. No recent hospitalization. Her spouse is here with similar symptoms.  LMP now.  ROS: See HPI Constitutional: chills, no fever  Eyes: no drainage  ENT: no runny nose   Cardiovascular:  no chest pain  Resp: no SOB  GI: abdominal pain, vomiting, nausea, diarrhea GU: no dysuria, no hematuria, no vaginal bleeding, no vaginal discharge Integumentary: no rash  Allergy: no hives  Musculoskeletal: no leg swelling  Neurological: no slurred speech ROS otherwise negative  PAST MEDICAL HISTORY/PAST SURGICAL HISTORY:  Past Medical History:  Diagnosis Date  . Hodgkin's lymphoma ([place])     MEDICATIONS:  Prior to Admission medications   Medication Sig Start Date End Date Taking? Authorizing Provider  ondansetron (ZOFRAN ODT) 4 MG disintegrating tablet Take 1 tablet (4 mg total) by mouth every 8 (eight) hours as needed for nausea or vomiting. 12/15/15   Carlisle Cater, PA-C    ALLERGIES:  No Known Allergies  SOCIAL HISTORY:  Social History  Substance Use Topics  . Smoking status: Current Some Day Smoker    Packs/day: 0.50    Types: Cigarettes  . Smokeless tobacco: Never Used  . Alcohol use No    FAMILY HISTORY: No family  history on file.  EXAM: BP 110/65 (BP Location: Left Arm)   Pulse 78   Temp 98.3 F (36.8 C) (Oral)   Resp 16   Ht 5\' 5"  (1.651 m)   Wt 110 lb (49.9 kg)   LMP 09/21/2016   SpO2 100%   BMI 18.30 kg/m  CONSTITUTIONAL: Alert and oriented and responds appropriately to questions. Well-appearing; well-nourished HEAD: Normocephalic EYES: Conjunctivae clear, pupils appear equal, EOMI ENT: normal nose; moist mucous membranes NECK: Supple, no meningismus, no nuchal rigidity, no LAD  CARD: RRR; S1 and S2 appreciated; no murmurs, no clicks, no rubs, no gallops RESP: Normal chest excursion without splinting or tachypnea; breath sounds clear and equal bilaterally; no wheezes, no rhonchi, no rales, no hypoxia or respiratory distress, speaking full sentences ABD/GI: Normal bowel sounds; non-distended; soft, non-tender, no rebound, no guarding, no peritoneal signs, no hepatosplenomegaly BACK:  The back appears normal and is non-tender to palpation, there is no CVA tenderness EXT: Normal ROM in all joints; non-tender to palpation; no edema; normal capillary refill; no cyanosis, no calf tenderness or swelling    SKIN: Normal color for age and race; warm; no rash NEURO: Moves all extremities equally PSYCH: The patient's mood and manner are appropriate. Grooming and personal hygiene are appropriate.  MEDICAL DECISION MAKING: Patient here with complains of abdominal cramping, nausea, vomiting or diarrhea. Spouse here with similar symptoms. Suspect viral gastroenteritis. Low suspicion for appendicitis, colitis, diverticulitis. Abdominal exam is benign. We'll treat symptomatically with Zofran, Imodium, Bentyl and reassess.  ED PROGRESS:  Patient's urine shows  large blood, small leukocytes and few bacteria. She is not having any urinary symptoms at this time. Small ketones seen. We'll encourage oral fluids. Pregnancy test is negative.  She is currently on her menstrual cycle which is likely the cause of blood  in her urine.  1:00 AM  Pt reports feeling better. Abdominal exam still benign. Able to drink without difficulty. Again suspect viral gastroenteritis. I feel she is safe to be discharged home. We'll discharge with prescriptions for Zofran, Imodium, Bentyl. Recommend a bland diet for the next several days. Recommended increased fluid intake at home.  At this time, I do not feel there is any life-threatening condition present. I have reviewed and discussed all results (EKG, imaging, lab, urine as appropriate) and exam findings with patient/family. I have reviewed nursing notes and appropriate previous records.  I feel the patient is safe to be discharged home without further emergent workup and can continue workup as an outpatient as needed. Discussed usual and customary return precautions. Patient/family verbalize understanding and are comfortable with this plan.  Outpatient follow-up has been provided if needed. All questions have been answered.    I personally performed the services described in this documentation, which was scribed in my presence. The recorded information has been reviewed and is accurate.     [place], DO 10/20/16 0100

## 2016-10-19 NOTE — ED Triage Notes (Signed)
Diarrhea and LLQ pain x2 days with nausea but no vomiting.  Significant other also has same sx.

## 2016-10-20 MED ORDER — LOPERAMIDE HCL 2 MG PO CAPS: 2.0000 mg | ORAL_CAPSULE | Freq: Four times a day (QID) | ORAL | 0 refills | Status: AC | PRN

## 2016-10-20 MED ORDER — ONDANSETRON 4 MG PO TBDP
4.0000 mg | ORAL_TABLET | Freq: Three times a day (TID) | ORAL | 0 refills | Status: DC | PRN
Start: 2016-10-20 — End: 2021-06-27

## 2016-10-20 MED ORDER — DICYCLOMINE HCL 20 MG PO TABS: 20.0000 mg | ORAL_TABLET | Freq: Three times a day (TID) | ORAL | 0 refills | Status: AC | PRN

## 2016-10-20 NOTE — Discharge Instructions (Signed)
To find a primary care or specialty doctor please call [phone] or [phone] to access "[facility] Find a Doctor Service." ° °You may also go on the [facility] website at [email or web address] ° °There are also multiple [facility], [facility], [facility] and [facility] practices throughout the [place] that are frequently accepting new patients. You may find a clinic that is close to your home and contact them. ° ° [facility] -  °[address] °[place] [address] °[phone] ° ° °[facility] -  °[address] °[place] [address] °[phone] ° ° °[facility] - °[address]  °[place]  [phone] [phone] ° ° °

## 2017-06-19 ENCOUNTER — Other Ambulatory Visit: Payer: Self-pay

## 2017-06-19 ENCOUNTER — Encounter (HOSPITAL_BASED_OUTPATIENT_CLINIC_OR_DEPARTMENT_OTHER): Payer: Self-pay | Admitting: Emergency Medicine

## 2017-06-19 ENCOUNTER — Emergency Department (HOSPITAL_BASED_OUTPATIENT_CLINIC_OR_DEPARTMENT_OTHER)
Admission: EM | Admit: 2017-06-19 | Discharge: 2017-06-19 | Disposition: A | Discharge status: Home / Self Care | Payer: Self-pay | Attending: Emergency Medicine | Admitting: Emergency Medicine

## 2017-06-19 ENCOUNTER — Emergency Department (HOSPITAL_BASED_OUTPATIENT_CLINIC_OR_DEPARTMENT_OTHER): Payer: Self-pay

## 2017-06-19 DIAGNOSIS — R590 Localized enlarged lymph nodes: Secondary | ICD-10-CM | POA: Insufficient documentation

## 2017-06-19 DIAGNOSIS — F17210 Nicotine dependence, cigarettes, uncomplicated: Secondary | ICD-10-CM | POA: Insufficient documentation

## 2017-06-19 DIAGNOSIS — Z8571 Personal history of Hodgkin lymphoma: Secondary | ICD-10-CM | POA: Insufficient documentation

## 2017-06-19 LAB — CBC WITH DIFFERENTIAL/PLATELET
BASOS ABS: 0 10*3/uL (ref 0.0–0.1)
Basophils Relative: 0 %
EOS PCT: 1 %
Eosinophils Absolute: 0.1 10*3/uL (ref 0.0–0.7)
HCT: 36 % (ref 36.0–46.0)
Hemoglobin: 11.7 g/dL — ABNORMAL LOW (ref 12.0–15.0)
LYMPHS PCT: 58 %
Lymphs Abs: 2.4 10*3/uL (ref 0.7–4.0)
MCH: 29.2 pg (ref 26.0–34.0)
MCHC: 32.5 g/dL (ref 30.0–36.0)
MCV: 89.8 fL (ref 78.0–100.0)
Monocytes Absolute: 0.4 10*3/uL (ref 0.1–1.0)
Monocytes Relative: 9 %
Neutro Abs: 1.3 10*3/uL — ABNORMAL LOW (ref 1.7–7.7)
Neutrophils Relative %: 32 %
PLATELETS: 139 10*3/uL — AB (ref 150–400)
RBC: 4.01 MIL/uL (ref 3.87–5.11)
RDW: 11.9 % (ref 11.5–15.5)
WBC: 4.2 10*3/uL (ref 4.0–10.5)

## 2017-06-19 LAB — BASIC METABOLIC PANEL
ANION GAP: 4 — AB (ref 5–15)
BUN: 9 mg/dL (ref 6–20)
CO2: 27 mmol/L (ref 22–32)
Calcium: 8.9 mg/dL (ref 8.9–10.3)
Chloride: 108 mmol/L (ref 101–111)
Creatinine, Ser: 0.9 mg/dL (ref 0.44–1.00)
GLUCOSE: 84 mg/dL (ref 65–99)
POTASSIUM: 3.4 mmol/L — AB (ref 3.5–5.1)
SODIUM: 139 mmol/L (ref 135–145)

## 2017-06-19 LAB — PREGNANCY, URINE: PREG TEST UR: NEGATIVE

## 2017-06-19 IMAGING — CT CT NECK W/ CM
3 of 4 series · 13 of 33 positions shown, 16 images · IV contrast (iopamidol)
Comparison: Prior PET-CT from 01/21/2015.

CLINICAL DATA: Initial evaluation for left-sided neck mass. History
of lymphoma.

EXAM:
CT NECK WITH CONTRAST
TECHNIQUE: Multidetector CT imaging of the neck was performed using the
standard protocol following the bolus administration of intravenous
contrast.
CONTRAST:  75mL [id number] IOPAMIDOL ([id number]) INJECTION 61%

[Series 3: axial neck · axial · 0.50mm/px · z∈[+881,+1031]mm · 5 of 113 slices shown, 7 images]
[im 19/113  soft-tissue]
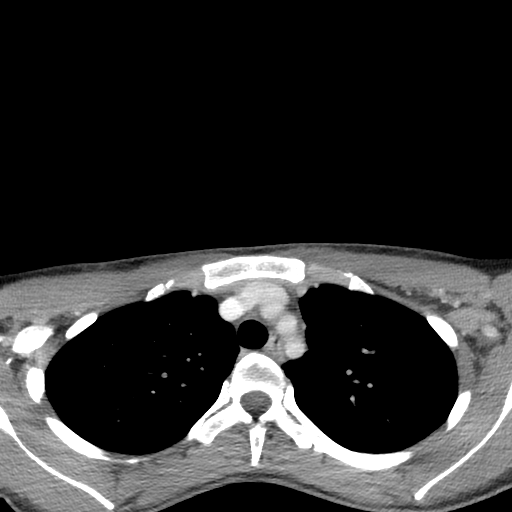
[im 19/113  bone]
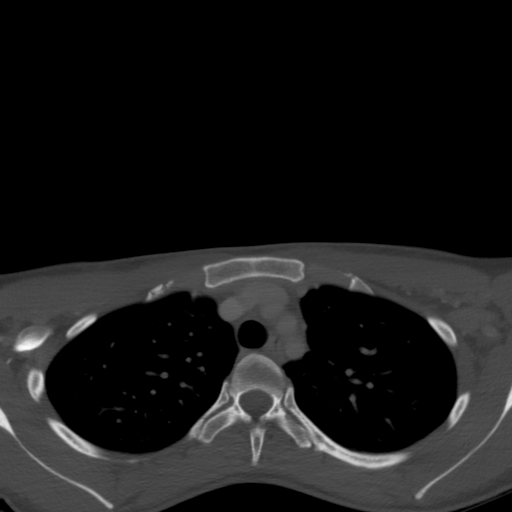
[im 38/113  bone]
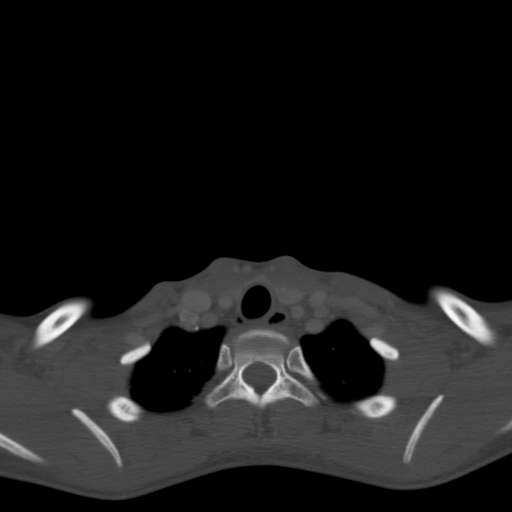
[im 57/113  bone]
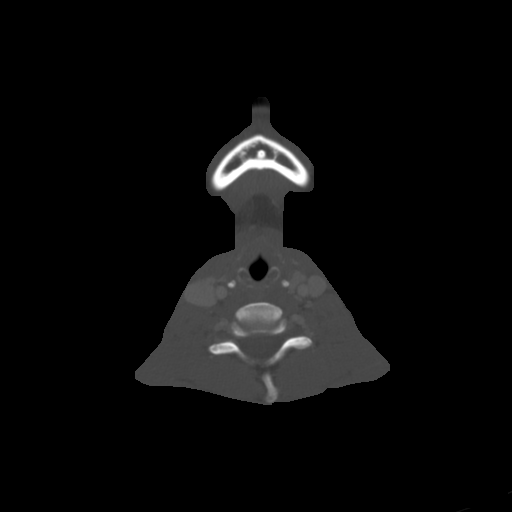
[im 75/113  bone]
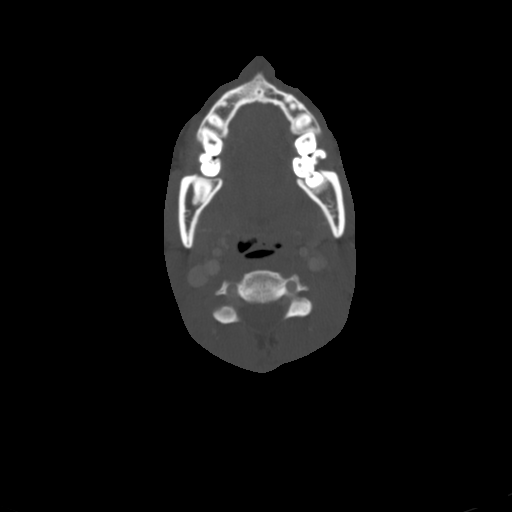
[im 94/113  soft-tissue]
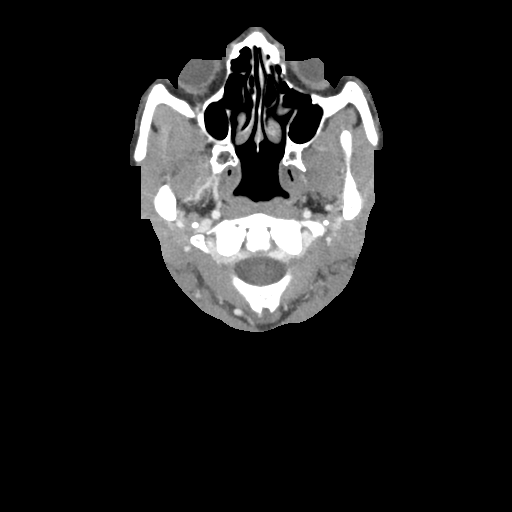
[im 94/113  bone]
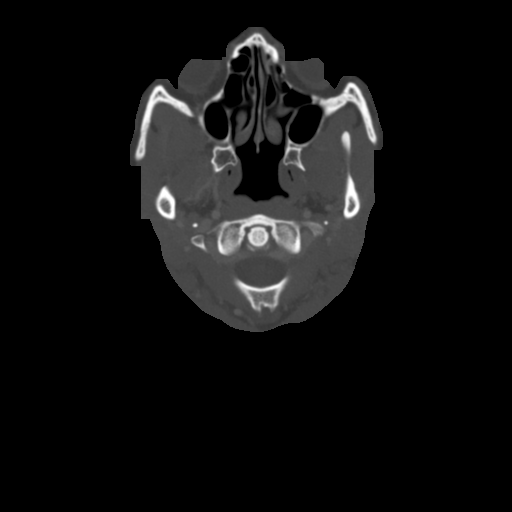

[Series 7: sag neck · sagittal · 0.44mm/px · 5 of 132 slices shown, 6 images]
[im 44/132  bone]
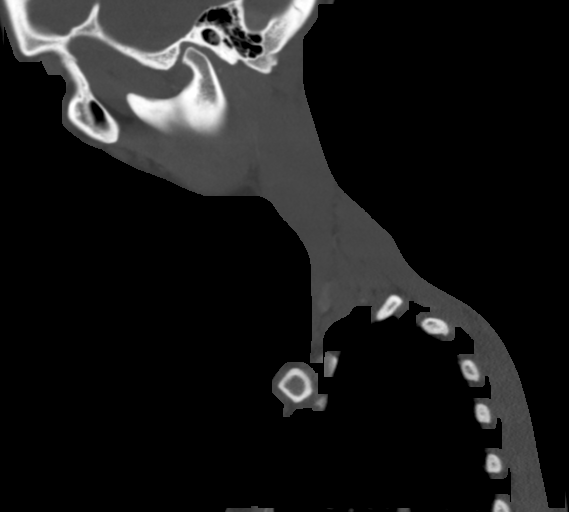
[im 55/132  bone]
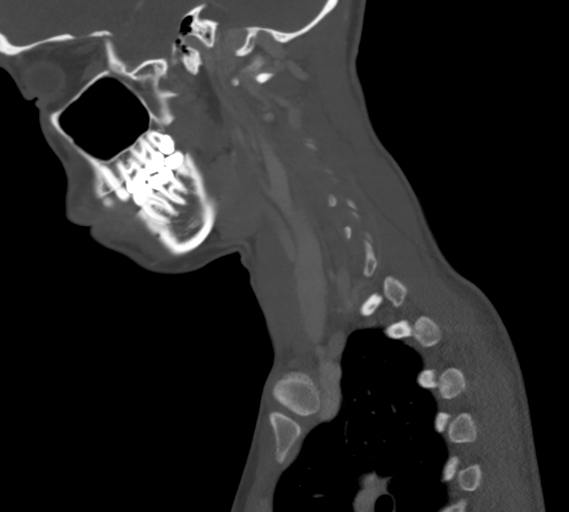
[im 66/132  soft-tissue]
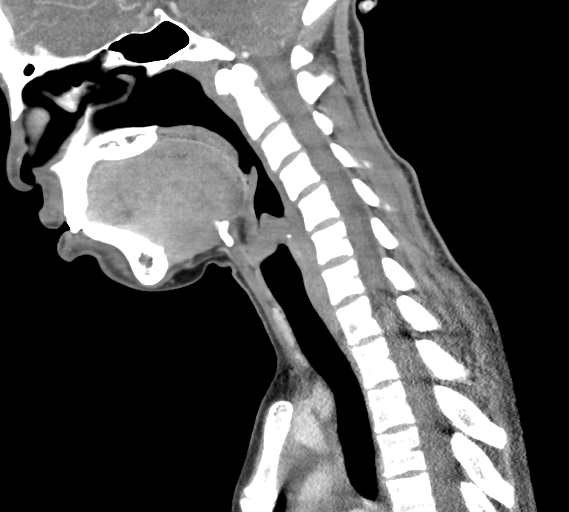
[im 66/132  bone]
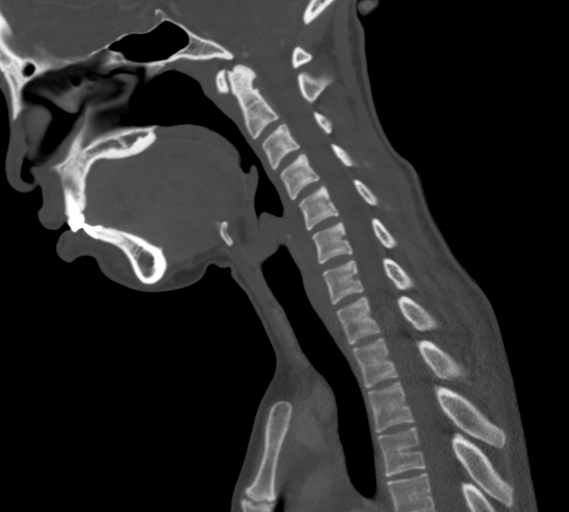
[im 77/132  bone]
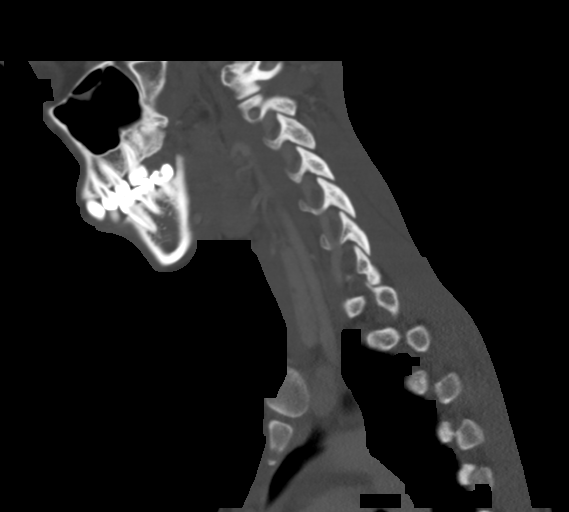
[im 88/132  bone]
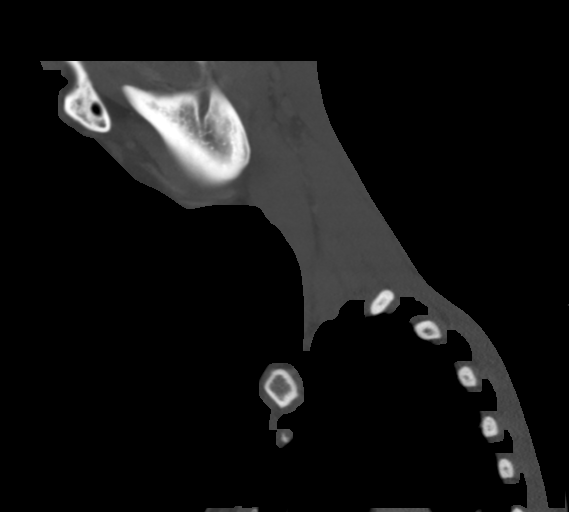

[Series 8: cor neck · coronal · 0.44mm/px · 3 of 101 slices shown]
[im 21/101  bone]
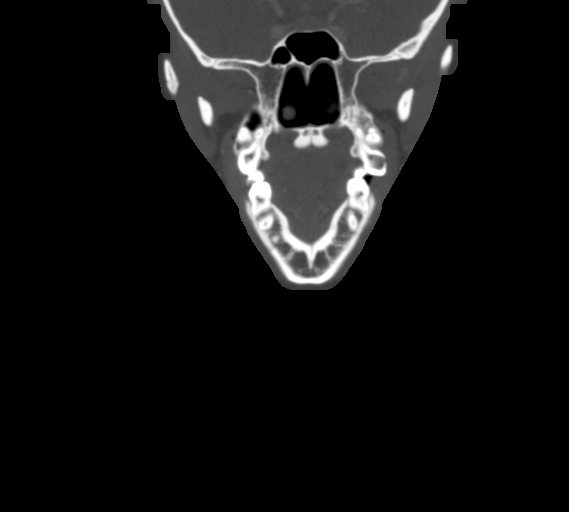
[im 41/101  bone]
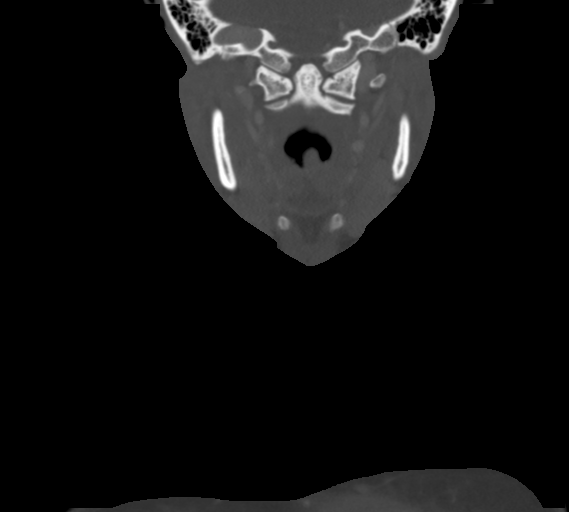
[im 61/101  bone]
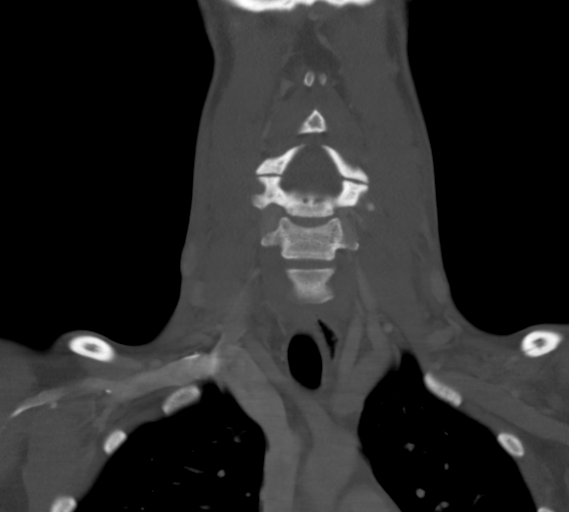

[13 of 33 positions shown; findings below may reference images not displayed]

FINDINGS: Pharynx and larynx: Oral cavity within normal limits without mass
lesion or loculated fluid collection. No acute abnormality about the
dentition. Palatine tonsils within normal limits. Parapharyngeal 5
preserved. Nasopharynx normal. Retropharyngeal soft tissues normal.
Epiglottis normal. Vallecula largely effaced by the lingual tonsils.
Remainder of the hypopharynx and supraglottic larynx within normal
limits. True cords symmetric and normal. Subglottic airway clear.

Salivary glands: Parotid and submandibular glands within normal
limits.

Thyroid: Tiny subcentimeter hypodense nodule noted within the left
lobe of thyroid, of doubtful significance. Thyroid otherwise
unremarkable.

Lymph nodes: 9 mm ovoid enhancing lesion within the left
supraclavicular region, likely reflecting a mildly prominent lymph
node (series 3, image 67). Finding is not definitely seen on prior
PET-CT from 01/21/2015, although direct comparison and is somewhat
difficult. Query this as palpable abnormality of concern. No other
pathologically enlarged lymph nodes identified within the neck.

Vascular: Normal intravascular enhancement seen throughout the neck.

Limited intracranial: Unremarkable.

Visualized orbits: Globes and orbital soft tissues within normal
limits.

Mastoids and visualized paranasal sinuses: Paranasal sinuses are
clear. Visualize mastoids and middle ear cavities are well
pneumatized and clear.

Skeleton: No acute osseus abnormality. No worrisome lytic or blastic
osseous lesions.

Upper chest: Visualized upper mediastinum within normal limits.
Partially visualized lungs are clear.

Other: None.
IMPRESSION: 1. 9 mm enhancing lesion within the left supraclavicular region,
likely a prominent/borderline enlarged lymph node. Query this as
palpable abnormality of concern. Finding is of uncertain etiology or
significance, and may be reactive in nature. Possible recurrent
lymphoproliferative disorder not entirely excluded, although no
other adenopathy is seen within the neck.
2. Otherwise normal neck CT.  No other acute abnormality identified.

## 2017-06-19 MED ORDER — IOPAMIDOL (ISOVUE-300) INJECTION 61%
100.0000 mL | Freq: Once | INTRAVENOUS | Status: AC | PRN
  Administered 2017-06-19: 75 mL via INTRAVENOUS

## 2017-06-19 NOTE — ED Notes (Addendum)
Alert, NAD, calm, interactive, resps e/u, speaking in clear complete sentences, no dyspnea noted, skin W&D, c/o L neck pain (denies: sob, nausea, dizziness, visual changes or other sx). Family at [facility].

## 2017-06-19 NOTE — ED Notes (Addendum)
EDP into room at [facility], prior to RN assessment, see MD notes, pending orders.

## 2017-06-19 NOTE — ED Notes (Signed)
No changes, Updated.

## 2017-06-19 NOTE — ED Triage Notes (Signed)
Pt presents with c/o neck pain and knot on left side of neck. Pt had non hodkins 2 yrs ago and is afraid it is back.

## 2017-06-19 NOTE — ED Notes (Signed)
Per pt's request; waiting on preg test results before proceeding with CT STN

## 2017-06-19 NOTE — ED Notes (Signed)
Resting comfortably, NAD, calm, updated, pending CT results.

## 2017-06-19 NOTE — ED Provider Notes (Signed)
[facility] EMERGENCY DEPARTMENT Provider Note   CSN: [id number] Arrival date & time: 06/19/17  0007     History   Chief Complaint Chief Complaint  Patient presents with  . Neck Pain    HPI Christina Hunt is a 26 y.o. female.  The patient is a 26 year old female with a history of Hodgkin's lymphoma in remission for 2 years now presenting today with a small lump to the left side of her neck with some mild tenderness.  Patient noticed this several days ago but was concerned because of her prior history.  Patient states that she has a follow-up just for surveillance on January 18 but she did not feel like she could wait that long.  She denies any recent cough, congestion or sore throat.  She has not had weight loss, fatigue, malaise or any other symptoms.  She states since getting chemotherapy she has intermittent nausea but that has not changed.  She also has not had a menstrual cycle in 6 months but states it has been abnormal since having chemotherapy several years ago.  She denies any abdominal pain, bleeding, urinary symptoms.   The history is provided by the patient.  Neck Pain   This is a new problem. The current episode started 2 days ago. The problem occurs constantly. The problem has not changed since onset.There has been no fever. Pertinent negatives include no chest pain, no syncope, no weight loss, no headaches and no weakness. She has tried nothing for the symptoms.    Past Medical History:  Diagnosis Date  . Hodgkin's lymphoma ([place])     There are no active problems to display for this patient.   History reviewed. No pertinent surgical history.  OB History    No data available       Home Medications    Prior to Admission medications   Medication Sig Start Date End Date Taking? Authorizing Provider  dicyclomine (BENTYL) 20 MG tablet Take 1 tablet (20 mg total) by mouth every 8 (eight) hours as needed for spasms. 10/20/16   Ward, Delice Bison, DO  loperamide  (IMODIUM) 2 MG capsule Take 1 capsule (2 mg total) by mouth 4 (four) times daily as needed for diarrhea or loose stools. 10/20/16   Ward, Delice Bison, DO  ondansetron (ZOFRAN ODT) 4 MG disintegrating tablet Take 1-2 tablets (4-8 mg total) by mouth every 8 (eight) hours as needed for nausea or vomiting. 10/20/16   Ward, Delice Bison, DO    Family History No family history on file.  Social History Social History   Tobacco Use  . Smoking status: Current Some Day Smoker    Packs/day: 0.50    Types: Cigarettes  . Smokeless tobacco: Never Used  Substance Use Topics  . Alcohol use: No  . Drug use: Yes    Types: Marijuana     Allergies   Patient has no known allergies.   Review of Systems Review of Systems  Constitutional: Negative for weight loss.  Cardiovascular: Negative for chest pain and syncope.  Musculoskeletal: Positive for neck pain.  Neurological: Negative for weakness and headaches.  All other systems reviewed and are negative.    Physical Exam Updated Vital Signs BP 102/67 (BP Location: Left Arm)   Pulse 63   Temp 98.2 F (36.8 C) (Oral)   Resp 20   Ht 5' 5.5" (1.664 m)   Wt 52.2 kg (115 lb)   LMP 12/18/2016 Comment: "used to be on depo"  SpO2 100%  BMI 18.85 kg/m   Physical Exam  Constitutional: She is oriented to person, place, and time. She appears well-developed and well-nourished. No distress.  HENT:  Head: Normocephalic and atraumatic.  Mouth/Throat: Oropharynx is clear and moist.  Eyes: Conjunctivae and EOM are normal. Pupils are equal, round, and reactive to light.  Neck: Normal range of motion. Neck supple.  Small tender mobile lymph node in the anterior cervical chain on the left.  Nontender small mobile lymph node in the right anterior cervical chain.  No supraclavicular nodes palpable  Cardiovascular: Normal rate, regular rhythm and intact distal pulses.  No murmur heard. Pulmonary/Chest: Effort normal and breath sounds normal. No respiratory  distress. She has no wheezes. She has no rales.  Abdominal: Soft. She exhibits no distension. There is no tenderness. There is no rebound and no guarding.  Musculoskeletal: Normal range of motion. She exhibits no edema or tenderness.  Lymphadenopathy:    She has cervical adenopathy.  Neurological: She is alert and oriented to person, place, and time.  Skin: Skin is warm and dry. No rash noted. No erythema.  Psychiatric: She has a normal mood and affect. Her behavior is normal.  Nursing note and vitals reviewed.    ED Treatments / Results  Labs (all labs ordered are listed, but only abnormal results are displayed) Labs Reviewed  CBC WITH DIFFERENTIAL/PLATELET - Abnormal; Notable for the following components:      Result Value   Hemoglobin 11.7 (*)    Platelets 139 (*)    Neutro Abs 1.3 (*)    All other components within normal limits  BASIC METABOLIC PANEL - Abnormal; Notable for the following components:   Potassium 3.4 (*)    Anion gap 4 (*)    All other components within normal limits  PREGNANCY, URINE    EKG  EKG Interpretation None       Radiology Ct Soft Tissue Neck W Contrast  Result Date: 06/19/2017 CLINICAL DATA:  Initial evaluation for left-sided neck mass. History of lymphoma. EXAM: CT NECK WITH CONTRAST TECHNIQUE: Multidetector CT imaging of the neck was performed using the standard protocol following the bolus administration of intravenous contrast. CONTRAST:  6mL ISOVUE-300 IOPAMIDOL (ISOVUE-300) INJECTION 61% COMPARISON:  Prior PET-CT from 01/21/2015. FINDINGS: Pharynx and larynx: Oral cavity within normal limits without mass lesion or loculated fluid collection. No acute abnormality about the dentition. [place] tonsils within normal limits. Parapharyngeal 5 preserved. Nasopharynx normal. Retropharyngeal soft tissues normal. Epiglottis normal. Vallecula largely effaced by the lingual tonsils. Remainder of the hypopharynx and supraglottic larynx within normal  limits. True cords symmetric and normal. Subglottic airway clear. Salivary glands: Parotid and submandibular glands within normal limits. Thyroid: Tiny subcentimeter hypodense nodule noted within the left lobe of thyroid, of doubtful significance. Thyroid otherwise unremarkable. Lymph nodes: 9 mm ovoid enhancing lesion within the left supraclavicular region, likely reflecting a mildly prominent lymph node (series 3, image 67). Finding is not definitely seen on prior PET-CT from 01/21/2015, although direct comparison and is somewhat difficult. Query this as palpable abnormality of concern. No other pathologically enlarged lymph nodes identified within the neck. Vascular: Normal intravascular enhancement seen throughout the neck. Limited intracranial: Unremarkable. Visualized orbits: Globes and orbital soft tissues within normal limits. Mastoids and visualized paranasal sinuses: Paranasal sinuses are clear. Visualize mastoids and middle ear cavities are well pneumatized and clear. Skeleton: No acute osseus abnormality. No worrisome lytic or blastic osseous lesions. Upper chest: Visualized upper mediastinum within normal limits. Partially visualized lungs are clear. Other:  None. IMPRESSION: 1. 9 mm enhancing lesion within the left supraclavicular region, likely a prominent/borderline enlarged lymph node. Query this as palpable abnormality of concern. Finding is of uncertain etiology or significance, and may be reactive in nature. Possible recurrent lymphoproliferative disorder not entirely excluded, although no other adenopathy is seen within the neck. 2. Otherwise normal neck CT.  No other acute abnormality identified. Electronically Signed   By: Jeannine Boga M.D.   On: 06/19/2017 02:54    Procedures Procedures (including critical care time)  Medications Ordered in ED Medications - No data to display   Initial Impression / Assessment and Plan / ED Course  I have reviewed the triage vital signs and  the nursing notes.  Pertinent labs & imaging results that were available during my care of the patient were reviewed by me and considered in my medical decision making (see chart for details).     Patient with a history of Hodgkin's lymphoma presenting with new lymph nodes in the cervical chain.  Given her history she was very concerned about recurrence of disease.  She has been in remission for 2 years.  She does have a surveillance follow-up on January 18 but did not feel she could wait that long.  She denies any other symptoms.  She has had no recent infections, congestion, sore throat or other cause for developing the lymph nodes.  She is otherwise felt her normal self.  Last menstrual period was 6 months ago so we will get a urine pregnancy, CBC, BMP and do a CT soft tissue neck for further evaluation  3:07 AM Labs reassuring.  CT showed only 1 node which maybe reactive vs recurrence of disease.  No other abnormalities at this time.  Findings discussed with pt and she will f/u with [facility] and continue to monitor the area.  Final Clinical Impressions(s) / ED Diagnoses   Final diagnoses:  Lymphadenopathy of left cervical region    ED Discharge Orders    None       Blanchie Dessert, MD 06/19/17 [phone]

## 2017-06-19 NOTE — ED Notes (Signed)
EDP into room to discuss CT/lab results.

## 2018-03-30 ENCOUNTER — Other Ambulatory Visit: Payer: Self-pay

## 2018-03-30 ENCOUNTER — Encounter (HOSPITAL_BASED_OUTPATIENT_CLINIC_OR_DEPARTMENT_OTHER): Payer: Self-pay | Admitting: Emergency Medicine

## 2018-03-30 ENCOUNTER — Emergency Department (HOSPITAL_BASED_OUTPATIENT_CLINIC_OR_DEPARTMENT_OTHER)
Admission: EM | Admit: 2018-03-30 | Discharge: 2018-03-30 | Disposition: A | Discharge status: Home / Self Care | Payer: Self-pay | Attending: Emergency Medicine | Admitting: Emergency Medicine

## 2018-03-30 DIAGNOSIS — F17210 Nicotine dependence, cigarettes, uncomplicated: Secondary | ICD-10-CM | POA: Insufficient documentation

## 2018-03-30 DIAGNOSIS — J069 Acute upper respiratory infection, unspecified: Secondary | ICD-10-CM | POA: Insufficient documentation

## 2018-03-30 LAB — GROUP A STREP BY PCR: Group A Strep by PCR: NOT DETECTED

## 2018-03-30 MED ORDER — ACETAMINOPHEN 325 MG PO TABS
650.0000 mg | ORAL_TABLET | Freq: Once | ORAL | Status: AC
  Administered 2018-03-30: 650 mg via ORAL
  Filled 2018-03-30: qty 2

## 2018-03-30 NOTE — ED Provider Notes (Signed)
[facility] EMERGENCY DEPARTMENT Provider Note   CSN: [id number] Arrival date & time: 03/30/18  6226     History   Chief Complaint Chief Complaint  Patient presents with  . URI    HPI Christina Hunt is a 27 y.o. female.  HPI  27 year old female with a history of Hodgkin's lymphoma in remission presents with sore throat.  Started yesterday at work.  Has not had a documented fever but states she felt hot when she woke up this morning.  It is painful to swallow.  She denies any significant cough besides when she first woke up and has not had any shortness of breath.  A little bit of a headache as well as some sneezing, congestion, and rhinorrhea.  She is tried some lozenges with partial relief.  Past Medical History:  Diagnosis Date  . Hodgkin's lymphoma ([place])     There are no active problems to display for this patient.   History reviewed. No pertinent surgical history.   OB History   None      Home Medications    Prior to Admission medications   Medication Sig Start Date End Date Taking? Authorizing Provider  dicyclomine (BENTYL) 20 MG tablet Take 1 tablet (20 mg total) by mouth every 8 (eight) hours as needed for spasms. 10/20/16   Ward, Delice Bison, DO  loperamide (IMODIUM) 2 MG capsule Take 1 capsule (2 mg total) by mouth 4 (four) times daily as needed for diarrhea or loose stools. 10/20/16   Ward, Delice Bison, DO  ondansetron (ZOFRAN ODT) 4 MG disintegrating tablet Take 1-2 tablets (4-8 mg total) by mouth every 8 (eight) hours as needed for nausea or vomiting. 10/20/16   Ward, Delice Bison, DO    Family History No family history on file.  Social History Social History   Tobacco Use  . Smoking status: Current Some Day Smoker    Packs/day: 0.50    Types: Cigarettes  . Smokeless tobacco: Never Used  Substance Use Topics  . Alcohol use: No  . Drug use: Yes    Types: Marijuana     Allergies   Patient has no known allergies.   Review of Systems Review of  Systems  Constitutional: Positive for fever (subjective).  HENT: Positive for congestion, rhinorrhea and sore throat. Negative for ear pain.   Respiratory: Positive for cough. Negative for shortness of breath.   Cardiovascular: Negative for chest pain.  Gastrointestinal: Negative for abdominal pain and vomiting.  Neurological: Positive for headaches.  All other systems reviewed and are negative.    Physical Exam Updated Vital Signs BP 108/64 (BP Location: Right Arm)   Pulse 61   Temp 98 F (36.7 C) (Oral)   Resp 16   Ht 5' 5.5" (1.664 m)   Wt 52.2 kg   LMP 03/23/2018   SpO2 100%   BMI 18.85 kg/m   Physical Exam  Constitutional: She is oriented to person, place, and time. She appears well-developed and well-nourished. No distress.  HENT:  Head: Normocephalic and atraumatic.  Right Ear: External ear normal.  Left Ear: External ear normal.  Nose: Nose normal.  Mouth/Throat: No oropharyngeal exudate.  Eyes: Right eye exhibits no discharge. Left eye exhibits no discharge.  Neck: Normal range of motion. Neck supple.  Cardiovascular: Normal rate, regular rhythm and normal heart sounds.  Pulmonary/Chest: Effort normal and breath sounds normal. She has no wheezes. She has no rales.  Abdominal: Soft. There is no tenderness.  Neurological: She is alert  and oriented to person, place, and time.  Skin: Skin is warm and dry. She is not diaphoretic.  Nursing note and vitals reviewed.    ED Treatments / Results  Labs (all labs ordered are listed, but only abnormal results are displayed) Labs Reviewed  GROUP A STREP BY PCR    EKG None  Radiology No results found.  Procedures Procedures (including critical care time)  Medications Ordered in ED Medications  acetaminophen (TYLENOL) tablet 650 mg (650 mg Oral Given 03/30/18 1008)     Initial Impression / Assessment and Plan / ED Course  I have reviewed the triage vital signs and the nursing notes.  Pertinent labs &  imaging results that were available during my care of the patient were reviewed by me and considered in my medical decision making (see chart for details).     Patient symptoms are consistent with a mild viral URI.  Given sore throat was the chief complaint, strep test obtained and is negative.  She is afebrile.  No signs or symptoms of deep space neck infection.  Appears quite well and I highly doubt acute bacterial infection such as pneumonia.  Discharged home with return precautions and symptomatic care.  Final Clinical Impressions(s) / ED Diagnoses   Final diagnoses:  Viral upper respiratory infection    ED Discharge Orders    None       Sherwood Gambler, MD 03/30/18 1113

## 2018-03-30 NOTE — Discharge Instructions (Addendum)
If you develop high fever, vomiting, trouble breathing, or any other new/concerning symptoms and return to the ER for evaluation.

## 2018-03-30 NOTE — ED Triage Notes (Addendum)
Sneezing, sore throat and feels like has a fever . Also H/A

## 2021-06-27 ENCOUNTER — Emergency Department (HOSPITAL_BASED_OUTPATIENT_CLINIC_OR_DEPARTMENT_OTHER)
Admission: EM | Admit: 2021-06-27 | Discharge: 2021-06-27 | Disposition: A | Discharge status: Home / Self Care | Payer: Self-pay | Attending: Emergency Medicine | Admitting: Emergency Medicine

## 2021-06-27 ENCOUNTER — Other Ambulatory Visit: Payer: Self-pay

## 2021-06-27 ENCOUNTER — Encounter (HOSPITAL_BASED_OUTPATIENT_CLINIC_OR_DEPARTMENT_OTHER): Payer: Self-pay | Admitting: *Deleted

## 2021-06-27 DIAGNOSIS — J111 Influenza due to unidentified influenza virus with other respiratory manifestations: Secondary | ICD-10-CM

## 2021-06-27 DIAGNOSIS — R110 Nausea: Secondary | ICD-10-CM | POA: Insufficient documentation

## 2021-06-27 DIAGNOSIS — R6883 Chills (without fever): Secondary | ICD-10-CM | POA: Insufficient documentation

## 2021-06-27 DIAGNOSIS — R059 Cough, unspecified: Secondary | ICD-10-CM | POA: Insufficient documentation

## 2021-06-27 DIAGNOSIS — M791 Myalgia, unspecified site: Secondary | ICD-10-CM | POA: Insufficient documentation

## 2021-06-27 DIAGNOSIS — Z8571 Personal history of Hodgkin lymphoma: Secondary | ICD-10-CM | POA: Insufficient documentation

## 2021-06-27 DIAGNOSIS — R197 Diarrhea, unspecified: Secondary | ICD-10-CM | POA: Insufficient documentation

## 2021-06-27 DIAGNOSIS — Z20822 Contact with and (suspected) exposure to covid-19: Secondary | ICD-10-CM | POA: Insufficient documentation

## 2021-06-27 LAB — RESP PANEL BY RT-PCR (FLU A&B, COVID) ARPGX2
Influenza A by PCR: NEGATIVE
Influenza B by PCR: NEGATIVE
SARS Coronavirus 2 by RT PCR: NEGATIVE

## 2021-06-27 LAB — PREGNANCY, URINE: Preg Test, Ur: NEGATIVE

## 2021-06-27 MED ORDER — ONDANSETRON 4 MG PO TBDP: 4.0000 mg | ORAL_TABLET | Freq: Three times a day (TID) | ORAL | 0 refills | Status: AC | PRN

## 2021-06-27 NOTE — ED Triage Notes (Addendum)
Pt c/o headache, feeling nauseated, subjective fever, cough, hot/cold, body pain x 2 days. Her co-workers have had the flu

## 2021-06-27 NOTE — ED Provider Notes (Signed)
[name] EMERGENCY DEPARTMENT Provider Note   CSN: [id number] Arrival date & time: 06/27/21  1625     History Chief Complaint  Patient presents with   Cough    Christina Hunt is a 30 y.o. female.  30 year old female presents with complaint of 2 days of chills, body aches, feeling hot and cold, nauseated with diarrhea.  Patient is exposed to coworkers who are positive for flu.  Denies cough, congestion, sore throat.  No other complaints or concerns.  Patient is sexually active, does not believe she could be pregnant.      Past Medical History:  Diagnosis Date   Hodgkin's lymphoma ([place])     There are no problems to display for this patient.   History reviewed. No pertinent surgical history.   OB History   No obstetric history on file.     No family history on file.  Social History   Tobacco Use   Smoking status: Former    Packs/day: 0.50    Types: Cigarettes   Smokeless tobacco: Never  Substance Use Topics   Alcohol use: Not Currently   Drug use: Yes    Types: Marijuana    Home Medications Prior to Admission medications   Medication Sig Start Date End Date Taking? Authorizing Provider  ondansetron (ZOFRAN-ODT) 4 MG disintegrating tablet Take 1 tablet (4 mg total) by mouth every 8 (eight) hours as needed for nausea or vomiting. 06/27/21  Yes Tacy Learn, PA-C  dicyclomine (BENTYL) 20 MG tablet Take 1 tablet (20 mg total) by mouth every 8 (eight) hours as needed for spasms. 10/20/16   Ward, Delice Bison, DO  loperamide (IMODIUM) 2 MG capsule Take 1 capsule (2 mg total) by mouth 4 (four) times daily as needed for diarrhea or loose stools. 10/20/16   Ward, Delice Bison, DO    Allergies    Patient has no known allergies.  Review of Systems   Review of Systems  Constitutional:  Positive for chills.  HENT:  Negative for congestion and sore throat.   Respiratory:  Negative for cough.   Cardiovascular:  Negative for chest pain.  Gastrointestinal:  Positive  for diarrhea and nausea. Negative for abdominal pain, constipation and vomiting.  Musculoskeletal:  Positive for arthralgias and myalgias.  Skin:  Negative for rash.  Allergic/Immunologic: Negative for immunocompromised state.  Neurological:  Positive for headaches. Negative for weakness.  Hematological:  Negative for adenopathy.  Psychiatric/Behavioral:  Negative for confusion.   All other systems reviewed and are negative.  Physical Exam Updated Vital Signs BP 112/65 (BP Location: Left Arm)   Pulse 64   Temp 98.5 F (36.9 C) (Oral)   Resp 18   Ht 5\' 5"  (1.651 m)   Wt 56.7 kg   SpO2 100%   BMI 20.80 kg/m   Physical Exam Vitals and nursing note reviewed.  Constitutional:      General: She is not in acute distress.    Appearance: She is well-developed. She is not diaphoretic.  HENT:     Head: Normocephalic and atraumatic.     Mouth/Throat:     Mouth: Mucous membranes are moist.     Pharynx: No oropharyngeal exudate or posterior oropharyngeal erythema.  Eyes:     Conjunctiva/sclera: Conjunctivae normal.  Cardiovascular:     Rate and Rhythm: Normal rate and regular rhythm.     Heart sounds: Normal heart sounds.  Pulmonary:     Effort: Pulmonary effort is normal.     Breath sounds: Normal  breath sounds.  Abdominal:     Palpations: Abdomen is soft.     Tenderness: There is no abdominal tenderness.  Skin:    General: Skin is warm and dry.     Findings: No erythema or rash.  Neurological:     Mental Status: She is alert and oriented to person, place, and time.  Psychiatric:        Behavior: Behavior normal.    ED Results / Procedures / Treatments   Labs (all labs ordered are listed, but only abnormal results are displayed) Labs Reviewed  RESP PANEL BY RT-PCR (FLU A&B, COVID) [id number]  PREGNANCY, URINE    EKG None  Radiology No results found.  Procedures Procedures   Medications Ordered in ED Medications - No data to display  ED Course  I have reviewed  the triage vital signs and the nursing notes.  Pertinent labs & imaging results that were available during my care of the patient were reviewed by me and considered in my medical decision making (see chart for details).  Clinical Course as of 06/27/21 1900  Sat Jun 27, 6048  443 30 year old female with complaint of body aches, diarrhea, nausea after exposure to coworkers were positive for the flu, concern for same. Denies abdominal pain, changes in bladder habits. Negative for flu and COVID. hCG is negative. Suspect viral illness, recommend Imodium if needed for the diarrhea, Motrin Tylenol as needed for body aches.  Follow-up with PCP, return to ED for worsening or concerning symptoms. [LM]    Clinical Course User Index [LM] Roque Lias   MDM Rules/Calculators/A&P                            Final Clinical Impression(s) / ED Diagnoses Final diagnoses:  Influenza-like illness    Rx / DC Orders ED Discharge Orders          Ordered    ondansetron (ZOFRAN-ODT) 4 MG disintegrating tablet  Every 8 hours PRN        06/27/21 1857             Tacy Learn, PA-C 06/27/21 1900    Long, W[facility], MD 06/27/21 1900

## 2021-06-27 NOTE — ED Notes (Signed)
Pt just used restroom and unable to provide urine sample at this time. Provided specimen cup.

## 2021-06-27 NOTE — Discharge Instructions (Addendum)
Your pregnancy test is negative today. You may continue with Motrin and Tylenol as instructed for body aches. You may take Imodium if needed for your diarrhea. Prescription for Zofran sent to your pharmacy for nausea. Recheck with your primary care provider if symptoms do not improve.

## 2022-02-16 ENCOUNTER — Encounter (HOSPITAL_BASED_OUTPATIENT_CLINIC_OR_DEPARTMENT_OTHER): Payer: Self-pay | Admitting: Emergency Medicine

## 2022-02-16 ENCOUNTER — Other Ambulatory Visit (HOSPITAL_BASED_OUTPATIENT_CLINIC_OR_DEPARTMENT_OTHER): Payer: Self-pay

## 2022-02-16 ENCOUNTER — Emergency Department (HOSPITAL_BASED_OUTPATIENT_CLINIC_OR_DEPARTMENT_OTHER): Payer: Self-pay

## 2022-02-16 ENCOUNTER — Other Ambulatory Visit: Payer: Self-pay

## 2022-02-16 ENCOUNTER — Emergency Department (HOSPITAL_BASED_OUTPATIENT_CLINIC_OR_DEPARTMENT_OTHER)
Admission: EM | Admit: 2022-02-16 | Discharge: 2022-02-16 | Disposition: A | Discharge status: Home / Self Care | Payer: Self-pay | Attending: Emergency Medicine | Admitting: Emergency Medicine

## 2022-02-16 DIAGNOSIS — D259 Leiomyoma of uterus, unspecified: Secondary | ICD-10-CM | POA: Insufficient documentation

## 2022-02-16 DIAGNOSIS — N83202 Unspecified ovarian cyst, left side: Secondary | ICD-10-CM

## 2022-02-16 DIAGNOSIS — N8312 Corpus luteum cyst of left ovary: Secondary | ICD-10-CM | POA: Insufficient documentation

## 2022-02-16 LAB — CBC
HCT: 38.5 % (ref 36.0–46.0)
Hemoglobin: 12.5 g/dL (ref 12.0–15.0)
MCH: 29.6 pg (ref 26.0–34.0)
MCHC: 32.5 g/dL (ref 30.0–36.0)
MCV: 91 fL (ref 80.0–100.0)
Platelets: 136 10*3/uL — ABNORMAL LOW (ref 150–400)
RBC: 4.23 MIL/uL (ref 3.87–5.11)
RDW: 12.3 % (ref 11.5–15.5)
WBC: 2.7 10*3/uL — ABNORMAL LOW (ref 4.0–10.5)
nRBC: 0 % (ref 0.0–0.2)

## 2022-02-16 LAB — URINALYSIS, MICROSCOPIC (REFLEX)

## 2022-02-16 LAB — URINALYSIS, ROUTINE W REFLEX MICROSCOPIC
Bilirubin Urine: NEGATIVE
Glucose, UA: NEGATIVE mg/dL
Ketones, ur: NEGATIVE mg/dL
Leukocytes,Ua: NEGATIVE
Nitrite: NEGATIVE
Protein, ur: NEGATIVE mg/dL
Specific Gravity, Urine: 1.01 (ref 1.005–1.030)
pH: 6.5 (ref 5.0–8.0)

## 2022-02-16 LAB — COMPREHENSIVE METABOLIC PANEL
ALT: 10 U/L (ref 0–44)
AST: 18 U/L (ref 15–41)
Albumin: 4.1 g/dL (ref 3.5–5.0)
Alkaline Phosphatase: 57 U/L (ref 38–126)
Anion gap: 5 (ref 5–15)
BUN: 10 mg/dL (ref 6–20)
CO2: 22 mmol/L (ref 22–32)
Calcium: 8.4 mg/dL — ABNORMAL LOW (ref 8.9–10.3)
Chloride: 109 mmol/L (ref 98–111)
Creatinine, Ser: 0.65 mg/dL (ref 0.44–1.00)
GFR, Estimated: >60 mL/min (ref >60)
Glucose, Bld: 81 mg/dL (ref 70–99)
Potassium: 3.5 mmol/L (ref 3.5–5.1)
Sodium: 136 mmol/L (ref 135–145)
Total Bilirubin: 0.7 mg/dL (ref 0.3–1.2)
Total Protein: 6.7 g/dL (ref 6.5–8.1)

## 2022-02-16 LAB — LIPASE, BLOOD: Lipase: 24 U/L (ref 11–51)

## 2022-02-16 LAB — PREGNANCY, URINE: Preg Test, Ur: NEGATIVE

## 2022-02-16 MED ORDER — LACTATED RINGERS IV BOLUS
1000.0000 mL | Freq: Once | INTRAVENOUS | Status: AC
  Administered 2022-02-16: 1000 mL via INTRAVENOUS

## 2022-02-16 MED ORDER — ONDANSETRON HCL 4 MG/2ML IJ SOLN
4.0000 mg | Freq: Once | INTRAMUSCULAR | Status: AC
  Administered 2022-02-16: 4 mg via INTRAVENOUS
  Filled 2022-02-16: qty 2

## 2022-02-16 MED ORDER — ONDANSETRON HCL 4 MG PO TABS
4.0000 mg | ORAL_TABLET | Freq: Four times a day (QID) | ORAL | 0 refills | Status: AC
  Filled 2022-02-16: qty 12, 3d supply, fill #0

## 2022-02-16 MED ORDER — KETOROLAC TROMETHAMINE 15 MG/ML IJ SOLN
15.0000 mg | Freq: Once | INTRAMUSCULAR | Status: AC
Start: 2022-02-16 — End: 2022-02-16
  Administered 2022-02-16: 15 mg via INTRAVENOUS
  Filled 2022-02-16: qty 1

## 2022-02-16 MED ORDER — OXYCODONE-ACETAMINOPHEN 5-325 MG PO TABS
1.0000 | ORAL_TABLET | Freq: Once | ORAL | Status: AC
  Administered 2022-02-16: 1 via ORAL
  Filled 2022-02-16: qty 1

## 2022-02-16 NOTE — ED Provider Notes (Signed)
[facility] Provider Note   CSN: [id number] Arrival date & time: 02/16/22  0909     History  Chief Complaint  Patient presents with   Abdominal Pain    Christina Hunt is a 31 y.o. female with history of Hodgkin's lymphoma currently in remission who presents to the emergency department for evaluation of pelvic pain x4 days.  Patient states that she was diagnosed with fibroids about 1 year ago and she has been having significant pain with her cycle since then.  She has been on her cycle for the last several days with moderate flow and bleeding lasting about 4 to 5 days typically.  She states that she has significant pain every month, and it seems to be worse this month.  She wants to come in and have this rechecked to make sure that there were no changes.  She states that she does not have an OB/GYN but does occasionally follow-up with her oncologist regarding her Hodgkin's lymphoma.  She reports tolerating fluids and food, however anytime she takes pain medicines she throws it back up.  She denies dysuria, hematuria, abnormal vaginal bleeding, vaginal discharge, vaginal pain, diarrhea, constipation, fevers and shortness of breath. She is not currently on medications.   Abdominal Pain Associated symptoms: vomiting   Associated symptoms: no diarrhea, no dysuria, no fever, no nausea, no shortness of breath, no vaginal bleeding and no vaginal discharge        Home Medications Prior to Admission medications   Medication Sig Start Date End Date Taking? Authorizing Provider  ondansetron (ZOFRAN) 4 MG tablet Take 1 tablet (4 mg total) by mouth every 6 (six) hours. 02/16/22  Yes Kathe Becton R, PA-C  dicyclomine (BENTYL) 20 MG tablet Take 1 tablet (20 mg total) by mouth every 8 (eight) hours as needed for spasms. 10/20/16   Ward, Delice Bison, DO  loperamide (IMODIUM) 2 MG capsule Take 1 capsule (2 mg total) by mouth 4 (four) times daily as needed for diarrhea or loose stools.  10/20/16   Ward, Delice Bison, DO  ondansetron (ZOFRAN-ODT) 4 MG disintegrating tablet Take 1 tablet (4 mg total) by mouth every 8 (eight) hours as needed for nausea or vomiting. 06/27/21   Tacy Learn, PA-C      Allergies    Patient has no known allergies.    Review of Systems   Review of Systems  Constitutional:  Negative for fever.  Respiratory:  Negative for shortness of breath.   Gastrointestinal:  Positive for abdominal pain and vomiting. Negative for diarrhea and nausea.  Genitourinary:  Positive for pelvic pain. Negative for dysuria, vaginal bleeding, vaginal discharge and vaginal pain.    Physical Exam Updated Vital Signs BP 136/81   Pulse 65   Temp 97.9 F (36.6 C) (Oral)   Resp 16   Ht '5\' 5"'$  (1.651 m)   Wt 54.4 kg   LMP 02/14/2022 (Exact Date)   SpO2 100%   BMI 19.97 kg/m  Physical Exam Vitals and nursing note reviewed.  Constitutional:      General: She is not in acute distress.    Appearance: She is not ill-appearing.  HENT:     Head: Atraumatic.  Eyes:     Conjunctiva/sclera: Conjunctivae normal.  Cardiovascular:     Rate and Rhythm: Normal rate and regular rhythm.     Pulses: Normal pulses.     Heart sounds: No murmur heard. Pulmonary:     Effort: Pulmonary effort is normal. No respiratory  distress.     Breath sounds: Normal breath sounds.  Abdominal:     General: Abdomen is flat. There is no distension.     Palpations: Abdomen is soft.     Tenderness: There is abdominal tenderness in the suprapubic area. There is no right CVA tenderness, left CVA tenderness or guarding. Negative signs include Murphy's sign and McBurney's sign.  Musculoskeletal:        General: Normal range of motion.     Cervical back: Normal range of motion.  Skin:    General: Skin is warm and dry.     Capillary Refill: Capillary refill takes less than 2 seconds.  Neurological:     General: No focal deficit present.     Mental Status: She is alert.  Psychiatric:        Mood  and Affect: Mood normal.     ED Results / Procedures / Treatments   Labs (all labs ordered are listed, but only abnormal results are displayed) Labs Reviewed  URINALYSIS, ROUTINE W REFLEX MICROSCOPIC - Abnormal; Notable for the following components:      Result Value   Hgb urine dipstick TRACE (*)    All other components within normal limits  COMPREHENSIVE METABOLIC PANEL - Abnormal; Notable for the following components:   Calcium 8.4 (*)    All other components within normal limits  CBC - Abnormal; Notable for the following components:   WBC 2.7 (*)    Platelets 136 (*)    All other components within normal limits  URINALYSIS, MICROSCOPIC (REFLEX) - Abnormal; Notable for the following components:   Bacteria, UA RARE (*)    All other components within normal limits  PREGNANCY, URINE  LIPASE, BLOOD  CA 125  CEA    EKG None  Radiology US PELVIC COMPLETE W TRANSVAGINAL AND TORSION R/O  Result Date: 02/16/2022 CLINICAL DATA:  Pelvic pain for 4 days, LMP 02/14/2022 EXAM: TRANSABDOMINAL AND TRANSVAGINAL ULTRASOUND OF PELVIS DOPPLER ULTRASOUND OF OVARIES TECHNIQUE: Both transabdominal and transvaginal ultrasound examinations of the pelvis were performed. Transabdominal technique was performed for global imaging of the pelvis including uterus, ovaries, adnexal regions, and pelvic cul-de-sac. It was necessary to proceed with endovaginal exam following the transabdominal exam to visualize the endometrium and ovaries. Color and duplex Doppler ultrasound was utilized to evaluate blood flow to the ovaries. COMPARISON:  06/27/2020 FINDINGS: Uterus Measurements: 9.8 x 6.1 x 6.2 cm = volume: 193 mL. Heterogeneous myometrium. Scattered areas of shadowing. Asymmetric thickening of the posterior uterine wall, ill-defined area measuring approximately 5.5 x 4.6 x 4.2 cm question subtle uterine leiomyoma less likely focal adenomyosis. No additional uterine masses. Endometrium Thickness: 3 mm.  No  endometrial fluid or mass Right ovary Measurements: 2.8 x 2.0 x 2.8 cm = volume: 8.2 mL. Normal morphology without mass. Blood flow present within RIGHT ovary on color Doppler imaging. Left ovary Measurements: 6.9 x 4.7 x 4.4 cm = volume: 73.3 mL. Large cyst within LEFT ovary 6.6 x 5.4 x 4.1 cm containing an anterior wall nonvascular mural nodule 11 mm diameter. Additional tiny 5 mm nodular focus. Single thin septation. Few scattered internal echoes. Blood flow present within LEFT ovary on color Doppler imaging. Pulsed Doppler evaluation of both ovaries demonstrates normal low-resistance arterial and venous waveforms. Other findings Small to moderate amount of free pelvic fluid. No additional adnexal masses. IMPRESSION: Unremarkable RIGHT ovary. Large complicated cyst of the LEFT ovary 6.6 cm greatest size containing a single thin septation and an 11 mm nonvascular  mural nodule as well as a smaller mural nodule; either further characterization by MR imaging with and without contrast or surgical evaluation recommended. Small to moderate amount of free pelvic fluid. Question subtle leiomyoma of the posterior wall of the uterus, less likely focal adenomyosis. Electronically Signed   By: Lavonia Dana M.D.   On: 02/16/2022 11:26    Procedures Procedures    Medications Ordered in ED Medications  lactated ringers bolus 1,000 mL (0 mLs Intravenous Stopped 02/16/22 1120)  ketorolac (TORADOL) 15 MG/ML injection 15 mg (15 mg Intravenous Given 02/16/22 0947)  ondansetron (ZOFRAN) injection 4 mg (4 mg Intravenous Given 02/16/22 0949)  oxyCODONE-acetaminophen (PERCOCET/ROXICET) 5-325 MG per tablet 1 tablet (1 tablet Oral Given 02/16/22 1113)    ED Course/ Medical Decision Making/ A&P                           Medical Decision Making Amount and/or Complexity of Data Reviewed Labs: ordered. Radiology: ordered.  Risk Prescription drug management.   Social determinants of health:  Social History   Socioeconomic  History   Marital status: Single    Spouse name: Not on file   Number of children: Not on file   Years of education: Not on file   Highest education level: Not on file  Occupational History   Not on file  Tobacco Use   Smoking status: Former    Packs/day: 0.50    Types: Cigarettes   Smokeless tobacco: Never  Substance and Sexual Activity   Alcohol use: Not Currently   Drug use: Yes    Types: Marijuana   Sexual activity: Yes    Birth control/protection: None  Other Topics Concern   Not on file  Social History Narrative   Not on file   Social Determinants of Health   Financial Resource Strain: Not on file  Food Insecurity: Not on file  Transportation Needs: Not on file  Physical Activity: Not on file  Stress: Not on file  Social Connections: Not on file  Intimate Partner Violence: Not on file     Initial impression:  This patient presents to the ED for concern of lower abdominal pain, this involves an extensive number of treatment options, and is a complaint that carries with it a high risk of complications and morbidity.   Differentials include of her lower abdominal pain include pelvic inflammatory disease, ectopic pregnancy, appendicitis, urinary calculi, primary dysmenorrhea, septic abortion, ruptured ovarian cyst or tumor, ovarian torsion, tubo-ovarian abscess, degeneration of fibroid, endometriosis, diverticulitis, cystitis.   Comorbidities affecting care:  Hodgkin's lymphoma in remission  Additional history obtained: Oncology records  Lab Tests  I Ordered, reviewed, and interpreted labs and EKG.  The pertinent results include:  CMP, CBC, lipase and UA without acute findings Pregnancy negative Pending CEA and CA125  Imaging Studies ordered:  I ordered imaging studies including  Pelvic ultrasound shows large complicated cyst of the left ovary approximately 6.6 cm with 2 mural nodules. I independently visualized and interpreted imaging and I agree with the  radiologist interpretation.   Medicines ordered and prescription drug management:  I ordered medication including: 1 L LR bolus Toradol 50 mg IV Zofran 4 mg IV Percocet Reevaluation of the patient after these medicines showed that the patient improved I have reviewed the patients home medicines and have made adjustments as needed  Consultations Obtained:  I requested consultation with gynecology,  and discussed lab and imaging findings as well as pertinent  plan - they recommend: Order CEA and CA125 and have her follow-up with women's health [place]  ED Course/Re-evaluation: Presents in no acute distress and is nontoxic-appearing.  Vitals without significant abnormality.  On exam, she has tenderness across the suprapubic region in a bandlike pattern.  Abdomen is otherwise soft and nondistended.  Negative CVA tenderness bilaterally.  Physical exam otherwise benign.  Labs are overall reassuring although her pelvic ultrasound indicates a large complicated cyst with 2 mural nodules.  I consulted with OB/GYN who recommends having her follow-up with women's health med [facility].  He also recommends adding on CEA 125 and CEA although these will not return today.  I discussed results with patient and she knows to call the women's health med center to schedule a follow-up appointment.  I have sent her in some Zofran to help with her nausea to help her better tolerate pain medications.  Return precautions were discussed.  She was discharged home in stable condition.  Disposition:  After consideration of the diagnostic results, physical exam, history and the patients response to treatment feel that the patent would benefit from discharge.   Ovarian cyst Uterine fibroid: Plan and management as described above. Discharged home in good condition.   Final Clinical Impression(s) / ED Diagnoses Final diagnoses:  Cyst of left ovary  Uterine leiomyoma, unspecified location    Rx /  DC Orders ED Discharge Orders          Ordered    ondansetron (ZOFRAN) 4 MG tablet  Every 6 hours        02/16/22 1220              Tonye Pearson, [place] 02/16/22 1225    Ezequiel Essex, MD 02/16/22 1503

## 2022-02-16 NOTE — ED Triage Notes (Signed)
Lower abdominal pain , Hx fibroids . On her cycle. Hx Hodgkin Lymphoma. Denies NV.

## 2022-02-16 NOTE — Discharge Instructions (Addendum)
Your imaging today shows that you have a large ovarian cyst in your left ovary with a few nodules surrounding it.  I have sent in a couple additional lab test which should return in a few days, however the OB/GYN recommends follow-up in order to rule out possible ovarian cancer.  I have sent you in some medications for nausea which you can dissolve under your tongue as needed which will help you better tolerate your pain medications.  I have given you a referral to the women's health care med Center here at [place] call their office and schedule follow-up appointment.

## 2022-02-17 LAB — CA 125: Cancer Antigen (CA) 125: 170 U/mL — ABNORMAL HIGH (ref 0.0–38.1)

## 2022-02-17 LAB — CEA: CEA: 2.3 ng/mL (ref 0.0–4.7)

## 2022-02-23 ENCOUNTER — Other Ambulatory Visit (HOSPITAL_BASED_OUTPATIENT_CLINIC_OR_DEPARTMENT_OTHER): Payer: Self-pay

## 2022-03-01 ENCOUNTER — Other Ambulatory Visit (HOSPITAL_BASED_OUTPATIENT_CLINIC_OR_DEPARTMENT_OTHER): Payer: Self-pay

## 2022-06-03 ENCOUNTER — Telehealth: Payer: Self-pay | Admitting: General Practice

## 2022-06-03 NOTE — Telephone Encounter (Signed)
Contacted the patient and confirm new patient appt on 11/21 at 8:50am

## 2022-06-08 ENCOUNTER — Ambulatory Visit: Payer: Self-pay | Admitting: Family Medicine

## 2022-07-17 ENCOUNTER — Encounter (HOSPITAL_BASED_OUTPATIENT_CLINIC_OR_DEPARTMENT_OTHER): Payer: Self-pay | Admitting: Urology

## 2022-07-17 ENCOUNTER — Emergency Department (HOSPITAL_BASED_OUTPATIENT_CLINIC_OR_DEPARTMENT_OTHER)
Admission: EM | Admit: 2022-07-17 | Discharge: 2022-07-17 | Disposition: A | Discharge status: Home / Self Care | Payer: Self-pay | Attending: Emergency Medicine | Admitting: Emergency Medicine

## 2022-07-17 DIAGNOSIS — H1032 Unspecified acute conjunctivitis, left eye: Secondary | ICD-10-CM | POA: Insufficient documentation

## 2022-07-17 DIAGNOSIS — Z1152 Encounter for screening for COVID-19: Secondary | ICD-10-CM | POA: Insufficient documentation

## 2022-07-17 LAB — RESP PANEL BY RT-PCR (RSV, FLU A&B, COVID)  RVPGX2
Influenza A by PCR: NEGATIVE
Influenza B by PCR: NEGATIVE
Resp Syncytial Virus by PCR: NEGATIVE
SARS Coronavirus 2 by RT PCR: NEGATIVE

## 2022-07-17 LAB — GROUP A STREP BY PCR: Group A Strep by PCR: NOT DETECTED

## 2022-07-17 MED ORDER — ERYTHROMYCIN 5 MG/GM OP OINT
TOPICAL_OINTMENT | Freq: Once | OPHTHALMIC | Status: AC
  Administered 2022-07-17: 1 via OPHTHALMIC
  Filled 2022-07-17: qty 3.5

## 2022-07-17 NOTE — ED Provider Notes (Signed)
   [facility] DEPARTMENT  Provider Note  CSN: [id number] Arrival date & time: 07/17/22 0205  History Chief Complaint  Patient presents with   Eye Drainage    Christina Hunt is a 31 y.o. female here for left eye drainage and crusting for the last several hours. Has had a sore throat recently, but no fever.   Home Medications Prior to Admission medications   Medication Sig Start Date End Date Taking? Authorizing Provider  dicyclomine (BENTYL) 20 MG tablet Take 1 tablet (20 mg total) by mouth every 8 (eight) hours as needed for spasms. 10/20/16   Ward, Delice Bison, DO  loperamide (IMODIUM) 2 MG capsule Take 1 capsule (2 mg total) by mouth 4 (four) times daily as needed for diarrhea or loose stools. 10/20/16   Ward, Delice Bison, DO  ondansetron (ZOFRAN) 4 MG tablet Take 1 tablet (4 mg total) by mouth every 6 (six) hours. 02/16/22   Tonye Pearson, PA-C  ondansetron (ZOFRAN-ODT) 4 MG disintegrating tablet Take 1 tablet (4 mg total) by mouth every 8 (eight) hours as needed for nausea or vomiting. 06/27/21   Tacy Learn, PA-C     Allergies    Patient has no known allergies.   Review of Systems   Review of Systems Please see HPI for pertinent positives and negatives  Physical Exam BP 118/73 (BP Location: Left Arm)   Pulse 70   Temp 98 F (36.7 C) (Oral)   Resp 18   Ht '5\' 5"'$  (1.651 m)   Wt 54.4 kg   SpO2 96%   BMI 19.97 kg/m   Physical Exam Vitals and nursing note reviewed.  HENT:     Head: Normocephalic.     Nose: Nose normal.  Eyes:     Comments: R eye is clear, no conjunctival injection and anterior chamber is clear. L eye with marked conjunctival injection, purulent drainage and crusting. Anterior chamber is clear. EOMI bilaterally.   Pulmonary:     Effort: Pulmonary effort is normal.  Musculoskeletal:        General: Normal range of motion.     Cervical back: Neck supple.  Skin:    Findings: No rash (on exposed skin).  Neurological:     Mental  Status: She is alert and oriented to person, place, and time.  Psychiatric:        Mood and Affect: Mood normal.     ED Results / Procedures / Treatments   EKG None  Procedures Procedures  Medications Ordered in the ED Medications  erythromycin ophthalmic ointment (has no administration in time range)    Initial Impression and Plan  Patient here with likely bacterial conjunctivitis. Nasal and strep swabs neg. Will begin erythromycin ointment and recommend she be sure to wash hands frequently, stay home from work until improved. She is not a contact lens wearer.   ED Course       MDM Rules/Calculators/A&P Medical Decision Making Problems Addressed: Acute bacterial conjunctivitis of left eye: acute illness or injury  Amount and/or Complexity of Data Reviewed Labs: ordered. Decision-making details documented in ED Course.  Risk Prescription drug management.    Final Clinical Impression(s) / ED Diagnoses Final diagnoses:  Acute bacterial conjunctivitis of left eye    Rx / DC Orders ED Discharge Orders     None        Truddie Hidden, MD 07/17/22 [phone]

## 2022-07-17 NOTE — Discharge Instructions (Addendum)
Please apply the antibiotic ointment to your left eye every 4 hours for the next 5-7 days.

## 2022-07-17 NOTE — ED Triage Notes (Signed)
Left eye redness and drainage that started at 1800  States itching  Pt also states ha and sore throat

## 2022-12-22 ENCOUNTER — Other Ambulatory Visit: Payer: Self-pay

## 2022-12-22 ENCOUNTER — Emergency Department (HOSPITAL_BASED_OUTPATIENT_CLINIC_OR_DEPARTMENT_OTHER)
Admission: EM | Admit: 2022-12-22 | Discharge: 2022-12-22 | Disposition: A | Discharge status: Home / Self Care | Payer: 59 | Attending: Emergency Medicine | Admitting: Emergency Medicine

## 2022-12-22 ENCOUNTER — Encounter (HOSPITAL_BASED_OUTPATIENT_CLINIC_OR_DEPARTMENT_OTHER): Payer: Self-pay | Admitting: Emergency Medicine

## 2022-12-22 DIAGNOSIS — T23121A Burn of first degree of single right finger (nail) except thumb, initial encounter: Secondary | ICD-10-CM | POA: Diagnosis present

## 2022-12-22 DIAGNOSIS — T23021A Burn of unspecified degree of single right finger (nail) except thumb, initial encounter: Secondary | ICD-10-CM

## 2022-12-22 DIAGNOSIS — Z23 Encounter for immunization: Secondary | ICD-10-CM | POA: Insufficient documentation

## 2022-12-22 DIAGNOSIS — X102XXA Contact with fats and cooking oils, initial encounter: Secondary | ICD-10-CM | POA: Insufficient documentation

## 2022-12-22 MED ORDER — BACITRACIN ZINC 500 UNIT/GM EX OINT
TOPICAL_OINTMENT | Freq: Once | CUTANEOUS | Status: AC
  Administered 2022-12-22: 31.5 via TOPICAL
  Filled 2022-12-22: qty 28.35

## 2022-12-22 MED ORDER — TETANUS-DIPHTH-ACELL PERTUSSIS 5-2.5-18.5 LF-MCG/0.5 IM SUSY
0.5000 mL | PREFILLED_SYRINGE | Freq: Once | INTRAMUSCULAR | Status: AC
  Administered 2022-12-22: 0.5 mL via INTRAMUSCULAR
  Filled 2022-12-22: qty 0.5

## 2022-12-22 NOTE — ED Notes (Signed)
Patient's finger, hand and wrist were covered with Bacitracin and then wrapped with sterile bandages.

## 2022-12-22 NOTE — ED Triage Notes (Addendum)
Pt w/ grease burn to RT middle finger last night; blister present

## 2022-12-22 NOTE — ED Provider Notes (Signed)
[facility] [facility] [facility] Provider Note   CSN: [id number] Arrival date & time: 12/22/22  1540     History  Chief Complaint  Patient presents with   Burn    Christina Hunt is a 32 y.o. female.  32 year old female, left hand dominant, presents with right hand burn after grease popped and burned dorsum of right hand/3rd proximal finger last night around 7pm. Reports blister today with pain. Last td unknown.        Home Medications Prior to Admission medications   Medication Sig Start Date End Date Taking? Authorizing Provider  dicyclomine (BENTYL) 20 MG tablet Take 1 tablet (20 mg total) by mouth every 8 (eight) hours as needed for spasms. 10/20/16   Ward, Layla Maw, DO  loperamide (IMODIUM) 2 MG capsule Take 1 capsule (2 mg total) by mouth 4 (four) times daily as needed for diarrhea or loose stools. 10/20/16   Ward, Layla Maw, DO  ondansetron (ZOFRAN) 4 MG tablet Take 1 tablet (4 mg total) by mouth every 6 (six) hours. 02/16/22   Janell Quiet, PA-C  ondansetron (ZOFRAN-ODT) 4 MG disintegrating tablet Take 1 tablet (4 mg total) by mouth every 8 (eight) hours as needed for nausea or vomiting. 06/27/21   Jeannie Fend, PA-C      Allergies    Patient has no known allergies.    Review of Systems   Review of Systems Negative except as per HPI Physical Exam Updated Vital Signs BP 118/71   Pulse 76   Temp 98.3 F (36.8 C) (Oral)   Resp 18   Ht 5\' 6"  (1.676 m)   Wt 57.2 kg   SpO2 100%   BMI 20.34 kg/m  Physical Exam Vitals and nursing note reviewed.  Constitutional:      General: She is not in acute distress.    Appearance: She is well-developed. She is not diaphoretic.  HENT:     Head: Normocephalic and atraumatic.  Cardiovascular:     Pulses: Normal pulses.  Pulmonary:     Effort: Pulmonary effort is normal.  Musculoskeletal:     Comments: Intact bullae over dorsum of right hand at the MCP extending to the proximal digit.   Skin:     General: Skin is warm and dry.  Neurological:     Mental Status: She is alert and oriented to person, place, and time.     Sensory: No sensory deficit.     Motor: No weakness.  Psychiatric:        Behavior: Behavior normal.     ED Results / Procedures / Treatments   Labs (all labs ordered are listed, but only abnormal results are displayed) Labs Reviewed - No data to display  EKG None  Radiology No results found.  Procedures Procedures    Medications Ordered in ED Medications  bacitracin ointment (has no administration in time range)  Tdap (BOOSTRIX) injection 0.5 mL (has no administration in time range)    ED Course/ Medical Decision Making/ A&P                             Medical Decision Making  33 yo female with burn to the right hand from grease 21 hours prior. Intact bullae as above. Brisk cap refill present, sensation intact. Non circumferential. Advised to leave blister intact, can dress with bacitracin. Can take Motrin and Tylenol for pain.  Final Clinical Impression(s) / ED Diagnoses Final diagnoses:  Burn of finger of right hand    Rx / DC Orders ED Discharge Orders     None         Jeannie Fend, PA-C 12/22/22 1602    Arby Barrette, MD 12/22/22 [phone]

## 2022-12-22 NOTE — Discharge Instructions (Signed)
Apply Bacitracin to the burn (NOT neosporin or triple antibiotic ointment). Keep wound clean.  Recheck with your doctor in 2 days.

## 2024-08-19 ENCOUNTER — Emergency Department (HOSPITAL_BASED_OUTPATIENT_CLINIC_OR_DEPARTMENT_OTHER)
Admission: EM | Admit: 2024-08-19 | Discharge: 2024-08-19 | Disposition: A | Discharge status: Home / Self Care | Payer: Self-pay | Attending: Emergency Medicine | Admitting: Emergency Medicine

## 2024-08-19 ENCOUNTER — Encounter (HOSPITAL_BASED_OUTPATIENT_CLINIC_OR_DEPARTMENT_OTHER): Payer: Self-pay | Admitting: Emergency Medicine

## 2024-08-19 DIAGNOSIS — N939 Abnormal uterine and vaginal bleeding, unspecified: Secondary | ICD-10-CM | POA: Insufficient documentation

## 2024-08-19 LAB — CBC WITH DIFFERENTIAL/PLATELET
Abs Immature Granulocytes: 0.02 10*3/uL (ref 0.00–0.07)
Basophils Absolute: 0 10*3/uL (ref 0.0–0.1)
Basophils Relative: 0 %
Eosinophils Absolute: 0.1 10*3/uL (ref 0.0–0.5)
Eosinophils Relative: 1 %
HCT: 36 % (ref 36.0–46.0)
Hemoglobin: 11.6 g/dL — ABNORMAL LOW (ref 12.0–15.0)
Immature Granulocytes: 0 %
Lymphocytes Relative: 41 %
Lymphs Abs: 2.4 10*3/uL (ref 0.7–4.0)
MCH: 28 pg (ref 26.0–34.0)
MCHC: 32.2 g/dL (ref 30.0–36.0)
MCV: 86.7 fL (ref 80.0–100.0)
Monocytes Absolute: 0.6 10*3/uL (ref 0.1–1.0)
Monocytes Relative: 10 %
Neutro Abs: 2.7 10*3/uL (ref 1.7–7.7)
Neutrophils Relative %: 48 %
Platelets: 193 10*3/uL (ref 150–400)
RBC: 4.15 MIL/uL (ref 3.87–5.11)
RDW: 12.7 % (ref 11.5–15.5)
WBC: 5.8 10*3/uL (ref 4.0–10.5)
nRBC: 0 % (ref 0.0–0.2)

## 2024-08-19 LAB — URINALYSIS, ROUTINE W REFLEX MICROSCOPIC
Bilirubin Urine: NEGATIVE
Glucose, UA: NEGATIVE mg/dL
Ketones, ur: NEGATIVE mg/dL
Nitrite: NEGATIVE
Protein, ur: 100 mg/dL — AB
Specific Gravity, Urine: >=1.03 (ref 1.005–1.030)
pH: 6 (ref 5.0–8.0)

## 2024-08-19 LAB — HCG, QUANTITATIVE, PREGNANCY: hCG, Beta Chain, Quant, S: <1 m[IU]/mL

## 2024-08-19 LAB — URINALYSIS, MICROSCOPIC (REFLEX): RBC / HPF: >50 RBC/hpf (ref 0–5)

## 2024-08-19 NOTE — ED Provider Notes (Signed)
 " [place] [facility] Provider Note   CSN: [id number] Arrival date & time: 08/19/24  2119     Patient presents with: Possible Pregnancy   Christina Hunt is a 34 y.o. female.   34 yo F with a cc of vaginal bleeding.  Patient states that she is normally pretty regular.  She ended up being late for her menstrual cycle and so took pregnancy test that was positive.  She is now 4 days after her normal cycle would have started and now is having vaginal bleeding that is consistent with her typical cycle.  Having some cramping consistent as well.  No fevers.   Possible Pregnancy       Prior to Admission medications  Medication Sig Start Date End Date Taking? Authorizing Provider  dicyclomine  (BENTYL ) 20 MG tablet Take 1 tablet (20 mg total) by mouth every 8 (eight) hours as needed for spasms. 10/20/16   Ward, Josette SAILOR, DO  loperamide  (IMODIUM ) 2 MG capsule Take 1 capsule (2 mg total) by mouth 4 (four) times daily as needed for diarrhea or loose stools. 10/20/16   Ward, Josette SAILOR, DO  ondansetron  (ZOFRAN ) 4 MG tablet Take 1 tablet (4 mg total) by mouth every 6 (six) hours. 02/16/22   Conklin, Erica R, PA-C  ondansetron  (ZOFRAN -ODT) 4 MG disintegrating tablet Take 1 tablet (4 mg total) by mouth every 8 (eight) hours as needed for nausea or vomiting. 06/27/21   Beverley Leita LABOR, PA-C    Allergies: Patient has no known allergies.    Review of Systems  Updated Vital Signs BP 129/77 (BP Location: Right Arm)   Pulse 61   Temp 98.2 F (36.8 C)   Resp 18   Ht 5' 6 (1.676 m)   Wt 63.5 kg   LMP 07/17/2024   SpO2 97%   BMI 22.60 kg/m   Physical Exam Vitals and nursing note reviewed.  Constitutional:      General: She is not in acute distress.    Appearance: She is well-developed. She is not diaphoretic.  HENT:     Head: Normocephalic and atraumatic.  Eyes:     Pupils: Pupils are equal, round, and reactive to light.  Cardiovascular:     Rate and Rhythm:  Normal rate and regular rhythm.     Heart sounds: No murmur heard.    No friction rub. No gallop.  Pulmonary:     Effort: Pulmonary effort is normal.     Breath sounds: No wheezing or rales.  Abdominal:     General: There is no distension.     Palpations: Abdomen is soft.     Tenderness: There is no abdominal tenderness.     Comments: No obvious abdominal discomfort  Musculoskeletal:        General: No tenderness.     Cervical back: Normal range of motion and neck supple.  Skin:    General: Skin is warm and dry.  Neurological:     Mental Status: She is alert and oriented to person, place, and time.  Psychiatric:        Behavior: Behavior normal.     (all labs ordered are listed, but only abnormal results are displayed) Labs Reviewed  CBC WITH DIFFERENTIAL/PLATELET - Abnormal; Notable for the following components:      Result Value   Hemoglobin 11.6 (*)    All other components within normal limits  URINALYSIS, ROUTINE W REFLEX MICROSCOPIC - Abnormal; Notable for the following components:  APPearance CLOUDY (*)    Hgb urine dipstick LARGE (*)    Protein, ur 100 (*)    Leukocytes,Ua TRACE (*)    All other components within normal limits  URINALYSIS, MICROSCOPIC (REFLEX) - Abnormal; Notable for the following components:   Bacteria, UA FEW (*)    All other components within normal limits  HCG, QUANTITATIVE, PREGNANCY    EKG: None  Radiology: No results found.   Procedures   Medications Ordered in the ED - No data to display                                  Medical Decision Making Amount and/or Complexity of Data Reviewed Labs: ordered.   34 yo F with a chief complaints of vaginal bleeding.  Patient she is approximately 4 days late on her menstrual cycle.  Feels like this is typical of her menses however she took a pregnancy test very shortly after she was late and it was positive at home.  Will check pregnancy test here.  Hemoglobin stable.  Pregnancy test  negative. UA without obvious infection.  10:59 PM:  I have discussed the diagnosis/risks/treatment options with the patient.  Evaluation and diagnostic testing in the emergency department does not suggest an emergent condition requiring admission or immediate intervention beyond what has been performed at this time.  They will follow up with PCP. We also discussed returning to the ED immediately if new or worsening sx occur. We discussed the sx which are most concerning (e.g., sudden worsening pain, fever, inability to tolerate by mouth) that necessitate immediate return. Medications administered to the patient during their visit and any new prescriptions provided to the patient are listed below.  Medications given during this visit Medications - No data to display   The patient appears reasonably screen and/or stabilized for discharge and I doubt any other medical condition or other [facility] requiring further screening, evaluation, or treatment in the ED at this time prior to discharge.        Final diagnoses:  Vaginal bleeding    ED Discharge Orders     None          Emil Share, DO 08/19/24 2259  "

## 2024-08-19 NOTE — ED Triage Notes (Signed)
 Pt had positive pregnancy test Fri; today she  reports vaginal bleeding and lower pelvic pain; this is generally the time her next menstrual cycle would have started

## 2024-08-19 NOTE — Discharge Instructions (Signed)
 Your pregnancy test here was negative.  Please follow-up with your family doctor and OB/GYN in clinic.
